# Patient Record
Sex: Female | Born: 1960 | Race: White | Hispanic: No | Smoking: Current every day smoker
Health system: Southern US, Community
[De-identification: ages and names within clinical notes are randomized; demographics above are authoritative.]

## PROBLEM LIST (undated history)

## (undated) DIAGNOSIS — N3281 Overactive bladder: Secondary | ICD-10-CM

## (undated) DIAGNOSIS — R0602 Shortness of breath: Secondary | ICD-10-CM

## (undated) DIAGNOSIS — Z87442 Personal history of urinary calculi: Secondary | ICD-10-CM

## (undated) DIAGNOSIS — M1990 Unspecified osteoarthritis, unspecified site: Secondary | ICD-10-CM

## (undated) DIAGNOSIS — N809 Endometriosis, unspecified: Secondary | ICD-10-CM

## (undated) DIAGNOSIS — T7840XA Allergy, unspecified, initial encounter: Secondary | ICD-10-CM

## (undated) DIAGNOSIS — J45909 Unspecified asthma, uncomplicated: Secondary | ICD-10-CM

## (undated) DIAGNOSIS — K5732 Diverticulitis of large intestine without perforation or abscess without bleeding: Secondary | ICD-10-CM

## (undated) DIAGNOSIS — F329 Major depressive disorder, single episode, unspecified: Secondary | ICD-10-CM

## (undated) DIAGNOSIS — E785 Hyperlipidemia, unspecified: Secondary | ICD-10-CM

## (undated) DIAGNOSIS — R51 Headache: Secondary | ICD-10-CM

## (undated) DIAGNOSIS — R519 Headache, unspecified: Secondary | ICD-10-CM

## (undated) DIAGNOSIS — F419 Anxiety disorder, unspecified: Secondary | ICD-10-CM

## (undated) DIAGNOSIS — K219 Gastro-esophageal reflux disease without esophagitis: Secondary | ICD-10-CM

## (undated) DIAGNOSIS — F32A Depression, unspecified: Secondary | ICD-10-CM

## (undated) DIAGNOSIS — I10 Essential (primary) hypertension: Secondary | ICD-10-CM

## (undated) HISTORY — DX: Hyperlipidemia, unspecified: E78.5

## (undated) HISTORY — DX: Diverticulitis of large intestine without perforation or abscess without bleeding: K57.32

## (undated) HISTORY — DX: Allergy, unspecified, initial encounter: T78.40XA

## (undated) HISTORY — DX: Personal history of urinary calculi: Z87.442

## (undated) HISTORY — PX: POLYPECTOMY: SHX149

## (undated) HISTORY — DX: Anxiety disorder, unspecified: F41.9

## (undated) HISTORY — DX: Overactive bladder: N32.81

## (undated) HISTORY — DX: Endometriosis, unspecified: N80.9

## (undated) HISTORY — DX: Headache, unspecified: R51.9

## (undated) HISTORY — DX: Major depressive disorder, single episode, unspecified: F32.9

## (undated) HISTORY — DX: Shortness of breath: R06.02

## (undated) HISTORY — DX: Depression, unspecified: F32.A

## (undated) HISTORY — DX: Headache: R51

## (undated) HISTORY — PX: HAND SURGERY: SHX662

## (undated) HISTORY — DX: Gastro-esophageal reflux disease without esophagitis: K21.9

## (undated) HISTORY — PX: COLON SURGERY: SHX602

## (undated) HISTORY — DX: Essential (primary) hypertension: I10

## (undated) HISTORY — PX: BACK SURGERY: SHX140

---

## 1998-11-08 ENCOUNTER — Emergency Department (HOSPITAL_COMMUNITY): Admission: EM | Admit: 1998-11-08 | Discharge: 1998-11-08 | Payer: Self-pay | Admitting: Emergency Medicine

## 1998-11-09 ENCOUNTER — Encounter: Payer: Self-pay | Admitting: Emergency Medicine

## 1998-12-10 ENCOUNTER — Ambulatory Visit (HOSPITAL_COMMUNITY): Admission: RE | Admit: 1998-12-10 | Discharge: 1998-12-10 | Payer: Self-pay | Admitting: Family Medicine

## 1998-12-10 ENCOUNTER — Encounter: Payer: Self-pay | Admitting: Family Medicine

## 1998-12-17 ENCOUNTER — Emergency Department (HOSPITAL_COMMUNITY): Admission: EM | Admit: 1998-12-17 | Discharge: 1998-12-17 | Payer: Self-pay | Admitting: Emergency Medicine

## 1998-12-17 ENCOUNTER — Encounter: Payer: Self-pay | Admitting: Emergency Medicine

## 1998-12-23 ENCOUNTER — Encounter: Admission: RE | Admit: 1998-12-23 | Discharge: 1998-12-23 | Payer: Self-pay | Admitting: Family Medicine

## 1998-12-23 ENCOUNTER — Encounter: Payer: Self-pay | Admitting: Family Medicine

## 2003-09-03 ENCOUNTER — Encounter: Admission: RE | Admit: 2003-09-03 | Discharge: 2003-09-03 | Payer: Self-pay | Admitting: Family Medicine

## 2003-12-10 ENCOUNTER — Ambulatory Visit: Payer: Self-pay | Admitting: Family Medicine

## 2004-01-08 ENCOUNTER — Emergency Department (HOSPITAL_COMMUNITY): Admission: EM | Admit: 2004-01-08 | Discharge: 2004-01-08 | Payer: Self-pay | Admitting: Family Medicine

## 2004-05-09 ENCOUNTER — Ambulatory Visit: Payer: Self-pay | Admitting: Family Medicine

## 2004-09-02 ENCOUNTER — Ambulatory Visit: Payer: Self-pay | Admitting: Family Medicine

## 2004-09-03 ENCOUNTER — Emergency Department (HOSPITAL_COMMUNITY): Admission: EM | Admit: 2004-09-03 | Discharge: 2004-09-03 | Payer: Self-pay | Admitting: Emergency Medicine

## 2005-02-20 ENCOUNTER — Other Ambulatory Visit: Admission: RE | Admit: 2005-02-20 | Discharge: 2005-02-20 | Payer: Self-pay | Admitting: Family Medicine

## 2005-02-20 ENCOUNTER — Ambulatory Visit: Payer: Self-pay | Admitting: Family Medicine

## 2005-02-20 ENCOUNTER — Encounter (INDEPENDENT_AMBULATORY_CARE_PROVIDER_SITE_OTHER): Payer: Self-pay | Admitting: Specialist

## 2005-02-22 ENCOUNTER — Ambulatory Visit: Payer: Self-pay | Admitting: Family Medicine

## 2005-06-08 ENCOUNTER — Ambulatory Visit: Payer: Self-pay | Admitting: Internal Medicine

## 2005-09-14 ENCOUNTER — Ambulatory Visit: Payer: Self-pay | Admitting: Family Medicine

## 2006-04-02 ENCOUNTER — Ambulatory Visit: Payer: Self-pay | Admitting: Family Medicine

## 2007-01-15 ENCOUNTER — Ambulatory Visit: Payer: Self-pay | Admitting: Family Medicine

## 2007-01-15 DIAGNOSIS — M479 Spondylosis, unspecified: Secondary | ICD-10-CM | POA: Insufficient documentation

## 2007-01-15 DIAGNOSIS — F32A Depression, unspecified: Secondary | ICD-10-CM | POA: Insufficient documentation

## 2007-01-15 DIAGNOSIS — I10 Essential (primary) hypertension: Secondary | ICD-10-CM | POA: Insufficient documentation

## 2007-01-15 DIAGNOSIS — F329 Major depressive disorder, single episode, unspecified: Secondary | ICD-10-CM

## 2007-02-05 ENCOUNTER — Telehealth: Payer: Self-pay | Admitting: Family Medicine

## 2007-04-05 ENCOUNTER — Ambulatory Visit: Payer: Self-pay | Admitting: Family Medicine

## 2007-04-05 ENCOUNTER — Encounter: Payer: Self-pay | Admitting: Internal Medicine

## 2007-04-08 LAB — CONVERTED CEMR LAB
ALT: 18 units/L (ref 0–35)
AST: 18 units/L (ref 0–37)
Albumin: 3.8 g/dL (ref 3.5–5.2)
Alkaline Phosphatase: 97 units/L (ref 39–117)
BUN: 11 mg/dL (ref 6–23)
Basophils Absolute: 0.1 10*3/uL (ref 0.0–0.1)
Basophils Relative: 0.7 % (ref 0.0–1.0)
Bilirubin, Direct: 0.2 mg/dL (ref 0.0–0.3)
CO2: 28 meq/L (ref 19–32)
Calcium: 9.6 mg/dL (ref 8.4–10.5)
Chloride: 104 meq/L (ref 96–112)
Creatinine, Ser: 0.6 mg/dL (ref 0.4–1.2)
Eosinophils Absolute: 0.1 10*3/uL (ref 0.0–0.6)
Eosinophils Relative: 0.7 % (ref 0.0–5.0)
GFR calc Af Amer: 140 mL/min
GFR calc non Af Amer: 115 mL/min
Glucose, Bld: 81 mg/dL (ref 70–99)
HCT: 43.1 % (ref 36.0–46.0)
Hemoglobin: 14.5 g/dL (ref 12.0–15.0)
Lymphocytes Relative: 29.3 % (ref 12.0–46.0)
MCHC: 33.5 g/dL (ref 30.0–36.0)
MCV: 92.9 fL (ref 78.0–100.0)
Monocytes Absolute: 0.7 10*3/uL (ref 0.2–0.7)
Monocytes Relative: 5.8 % (ref 3.0–11.0)
Neutro Abs: 7.7 10*3/uL (ref 1.4–7.7)
Neutrophils Relative %: 63.5 % (ref 43.0–77.0)
Platelets: 377 10*3/uL (ref 150–400)
Potassium: 4.5 meq/L (ref 3.5–5.1)
RBC: 4.64 M/uL (ref 3.87–5.11)
RDW: 12.2 % (ref 11.5–14.6)
Sodium: 136 meq/L (ref 135–145)
TSH: 1.09 microintl units/mL (ref 0.35–5.50)
Total Bilirubin: 0.5 mg/dL (ref 0.3–1.2)
Total Protein: 6.6 g/dL (ref 6.0–8.3)
WBC: 12.2 10*3/uL — ABNORMAL HIGH (ref 4.5–10.5)

## 2007-04-26 ENCOUNTER — Ambulatory Visit: Payer: Self-pay | Admitting: Family Medicine

## 2007-06-06 ENCOUNTER — Telehealth: Payer: Self-pay | Admitting: Family Medicine

## 2007-08-09 ENCOUNTER — Ambulatory Visit: Payer: Self-pay | Admitting: Family Medicine

## 2007-09-06 ENCOUNTER — Ambulatory Visit: Payer: Self-pay | Admitting: Family Medicine

## 2007-09-20 ENCOUNTER — Telehealth: Payer: Self-pay | Admitting: Family Medicine

## 2007-10-29 ENCOUNTER — Ambulatory Visit: Payer: Self-pay | Admitting: Family Medicine

## 2007-10-29 DIAGNOSIS — N951 Menopausal and female climacteric states: Secondary | ICD-10-CM | POA: Insufficient documentation

## 2007-11-21 ENCOUNTER — Ambulatory Visit: Payer: Self-pay | Admitting: Family Medicine

## 2007-11-21 ENCOUNTER — Telehealth: Payer: Self-pay | Admitting: Family Medicine

## 2007-12-05 ENCOUNTER — Telehealth: Payer: Self-pay | Admitting: Family Medicine

## 2008-03-12 ENCOUNTER — Telehealth: Payer: Self-pay | Admitting: Family Medicine

## 2008-03-31 ENCOUNTER — Telehealth: Payer: Self-pay | Admitting: Family Medicine

## 2008-04-07 ENCOUNTER — Telehealth: Payer: Self-pay | Admitting: Family Medicine

## 2008-08-11 ENCOUNTER — Ambulatory Visit: Payer: Self-pay | Admitting: Family Medicine

## 2008-08-11 LAB — CONVERTED CEMR LAB
Bilirubin Urine: NEGATIVE
Blood in Urine, dipstick: NEGATIVE
Glucose, Urine, Semiquant: NEGATIVE
Ketones, urine, test strip: NEGATIVE
Nitrite: NEGATIVE
Protein, U semiquant: NEGATIVE
Specific Gravity, Urine: 1.025
Urobilinogen, UA: 0.2
WBC Urine, dipstick: NEGATIVE
pH: 5

## 2008-08-12 ENCOUNTER — Encounter: Payer: Self-pay | Admitting: Family Medicine

## 2008-08-12 LAB — CONVERTED CEMR LAB
ALT: 17 units/L (ref 0–35)
AST: 17 units/L (ref 0–37)
Albumin: 3.7 g/dL (ref 3.5–5.2)
Alkaline Phosphatase: 94 units/L (ref 39–117)
BUN: 16 mg/dL (ref 6–23)
Basophils Absolute: 0 10*3/uL (ref 0.0–0.1)
Basophils Relative: 0.3 % (ref 0.0–3.0)
Bilirubin, Direct: 0 mg/dL (ref 0.0–0.3)
CO2: 28 meq/L (ref 19–32)
Calcium: 8.9 mg/dL (ref 8.4–10.5)
Chloride: 108 meq/L (ref 96–112)
Cholesterol: 208 mg/dL — ABNORMAL HIGH (ref 0–200)
Creatinine, Ser: 0.6 mg/dL (ref 0.4–1.2)
Direct LDL: 149.3 mg/dL
Eosinophils Absolute: 0.1 10*3/uL (ref 0.0–0.7)
Eosinophils Relative: 0.9 % (ref 0.0–5.0)
GFR calc non Af Amer: 114.29 mL/min (ref >60)
Glucose, Bld: 84 mg/dL (ref 70–99)
HCT: 43.1 % (ref 36.0–46.0)
HDL: 30.4 mg/dL — ABNORMAL LOW (ref >39.00)
Hemoglobin: 15 g/dL (ref 12.0–15.0)
Lymphocytes Relative: 33.2 % (ref 12.0–46.0)
Lymphs Abs: 3 10*3/uL (ref 0.7–4.0)
MCHC: 34.8 g/dL (ref 30.0–36.0)
MCV: 92.8 fL (ref 78.0–100.0)
Monocytes Absolute: 0.6 10*3/uL (ref 0.1–1.0)
Monocytes Relative: 6.6 % (ref 3.0–12.0)
Neutro Abs: 5.3 10*3/uL (ref 1.4–7.7)
Neutrophils Relative %: 59 % (ref 43.0–77.0)
Platelets: 312 10*3/uL (ref 150.0–400.0)
Potassium: 4.3 meq/L (ref 3.5–5.1)
RBC: 4.64 M/uL (ref 3.87–5.11)
RDW: 12.3 % (ref 11.5–14.6)
Sodium: 141 meq/L (ref 135–145)
TSH: 1.31 microintl units/mL (ref 0.35–5.50)
Total Bilirubin: 0.6 mg/dL (ref 0.3–1.2)
Total CHOL/HDL Ratio: 7
Total Protein: 6.8 g/dL (ref 6.0–8.3)
Triglycerides: 169 mg/dL — ABNORMAL HIGH (ref 0.0–149.0)
VLDL: 33.8 mg/dL (ref 0.0–40.0)
WBC: 9 10*3/uL (ref 4.5–10.5)

## 2008-08-17 ENCOUNTER — Encounter: Payer: Self-pay | Admitting: Family Medicine

## 2008-08-18 ENCOUNTER — Ambulatory Visit: Payer: Self-pay | Admitting: Family Medicine

## 2008-09-24 ENCOUNTER — Telehealth: Payer: Self-pay | Admitting: Family Medicine

## 2008-09-29 ENCOUNTER — Telehealth: Payer: Self-pay | Admitting: Family Medicine

## 2008-10-01 ENCOUNTER — Encounter: Payer: Self-pay | Admitting: Family Medicine

## 2008-10-16 ENCOUNTER — Ambulatory Visit: Payer: Self-pay | Admitting: Family Medicine

## 2008-10-20 LAB — CONVERTED CEMR LAB
Chlamydia, DNA Probe: NEGATIVE
GC Probe Amp, Genital: NEGATIVE
HCV Ab: NEGATIVE
Hep B Core Total Ab: NEGATIVE
Hep B S Ab: NEGATIVE
Hepatitis B Surface Ag: NEGATIVE

## 2008-11-03 ENCOUNTER — Telehealth: Payer: Self-pay | Admitting: Family Medicine

## 2009-01-12 ENCOUNTER — Telehealth: Payer: Self-pay | Admitting: Family Medicine

## 2009-01-28 ENCOUNTER — Ambulatory Visit: Payer: Self-pay | Admitting: Family Medicine

## 2009-01-28 DIAGNOSIS — K219 Gastro-esophageal reflux disease without esophagitis: Secondary | ICD-10-CM | POA: Insufficient documentation

## 2009-01-28 DIAGNOSIS — M1990 Unspecified osteoarthritis, unspecified site: Secondary | ICD-10-CM | POA: Insufficient documentation

## 2009-01-28 DIAGNOSIS — R1013 Epigastric pain: Secondary | ICD-10-CM | POA: Insufficient documentation

## 2009-02-01 ENCOUNTER — Encounter: Admission: RE | Admit: 2009-02-01 | Discharge: 2009-02-01 | Payer: Self-pay | Admitting: Family Medicine

## 2009-02-08 ENCOUNTER — Ambulatory Visit: Payer: Self-pay | Admitting: Family Medicine

## 2009-03-15 ENCOUNTER — Telehealth: Payer: Self-pay | Admitting: Family Medicine

## 2009-03-17 ENCOUNTER — Encounter: Payer: Self-pay | Admitting: Family Medicine

## 2009-03-19 ENCOUNTER — Telehealth: Payer: Self-pay | Admitting: Family Medicine

## 2009-03-22 ENCOUNTER — Telehealth: Payer: Self-pay | Admitting: Family Medicine

## 2009-03-23 ENCOUNTER — Ambulatory Visit: Payer: Self-pay | Admitting: Family Medicine

## 2009-03-25 ENCOUNTER — Encounter (INDEPENDENT_AMBULATORY_CARE_PROVIDER_SITE_OTHER): Payer: Self-pay | Admitting: *Deleted

## 2009-03-30 ENCOUNTER — Telehealth: Payer: Self-pay | Admitting: Family Medicine

## 2009-04-21 ENCOUNTER — Ambulatory Visit: Payer: Self-pay | Admitting: Gastroenterology

## 2009-04-21 ENCOUNTER — Encounter (INDEPENDENT_AMBULATORY_CARE_PROVIDER_SITE_OTHER): Payer: Self-pay | Admitting: *Deleted

## 2009-04-21 DIAGNOSIS — R109 Unspecified abdominal pain: Secondary | ICD-10-CM | POA: Insufficient documentation

## 2009-05-20 ENCOUNTER — Telehealth: Payer: Self-pay | Admitting: Gastroenterology

## 2009-06-24 ENCOUNTER — Telehealth: Payer: Self-pay | Admitting: Gastroenterology

## 2009-06-25 ENCOUNTER — Ambulatory Visit: Payer: Self-pay | Admitting: Gastroenterology

## 2009-06-25 HISTORY — PX: COLONOSCOPY: SHX174

## 2009-06-28 ENCOUNTER — Encounter: Payer: Self-pay | Admitting: Gastroenterology

## 2009-10-27 ENCOUNTER — Telehealth: Payer: Self-pay | Admitting: Family Medicine

## 2009-11-05 ENCOUNTER — Telehealth: Payer: Self-pay | Admitting: Family Medicine

## 2009-11-08 ENCOUNTER — Telehealth: Payer: Self-pay | Admitting: Family Medicine

## 2009-12-25 ENCOUNTER — Ambulatory Visit: Payer: Self-pay | Admitting: Internal Medicine

## 2009-12-30 ENCOUNTER — Telehealth: Payer: Self-pay | Admitting: Family Medicine

## 2010-01-07 ENCOUNTER — Telehealth: Payer: Self-pay | Admitting: Family Medicine

## 2010-02-07 ENCOUNTER — Ambulatory Visit
Admission: RE | Admit: 2010-02-07 | Discharge: 2010-02-07 | Payer: Self-pay | Source: Home / Self Care | Attending: Family Medicine | Admitting: Family Medicine

## 2010-02-07 DIAGNOSIS — F909 Attention-deficit hyperactivity disorder, unspecified type: Secondary | ICD-10-CM | POA: Insufficient documentation

## 2010-02-13 ENCOUNTER — Encounter: Payer: Self-pay | Admitting: Family Medicine

## 2010-02-24 NOTE — Progress Notes (Signed)
Summary: Colon tomorrow   Phone Note Call from Patient Call back at [phone] work   Call For: Dr Russella Dar Summary of Call: Didnt realize she should not have had the chicken salad sandwich she just ate for lunchtoday. She is scheduled for a Colonoscopy  tomorrow at 10:30am.  Initial call taken by: Leanor Kail [facility],  June 24, 2009 1:10 PM  Follow-up for Phone Call        Dr Russella Dar is off this afternoon so Dr. Marina Goodell was consulted.  Pts. phone call returned and pt was advised to go ahead with prep.  She was encouraged to drink plenty of clear liquids today to assist with the prep.  Pt. verbalized understanding. Follow-up by: Jennye Boroughs RN,  June 24, 2009 2:01 PM

## 2010-02-24 NOTE — Progress Notes (Signed)
Summary: Pts Zolft hasnt been mail out yet. Pt out of med.  Phone Note Call from Patient Call back at [facility] Phone [phone] Call back at Work Phone [phone]   Caller: Patient Summary of Call: Pt called and mail order pharmacy has not sent pt her Zoloft 200mg . Pt is completely out of meds. Pt want Dr. Clent Ridges to call in a partial refill to [facility] pharmacy today, to last patient until her mail order arrives.   Initial call taken by: Lucy Antigua,  January 12, 2009 9:06 AM  Follow-up for Phone Call        Phone Call Completed, Rx Called In Follow-up by: Alfred Levins, CMA,  January 12, 2009 9:11 AM    Prescriptions: ZOLOFT 100 MG TABS (SERTRALINE HCL) 2 once daily  #60 x 0   Entered by:   Alfred Levins, CMA   Authorized by:   Nelwyn Salisbury MD   Signed by:   Alfred Levins, CMA on 01/12/2009   Method used:   Electronically to        [facility] (retail)       [address]       [place], [place]  [address]       Ph: [phone]       Fax: [phone]   RxID:   [id number]

## 2010-02-24 NOTE — Assessment & Plan Note (Signed)
Summary: ?STD/CJR   Vital Signs:  Patient profile:   50 year old female Weight:      182 pounds Temp:     98.3 degrees F oral Pulse rate:   126 / minute BP sitting:   144 / 90  (left arm) Cuff size:   large  Vitals Entered By: Alfred Levins, CMA (October 16, 2008 10:33 AM) CC: std testing   History of Present Illness: here to get STD testing since she found out 2 weeks ago that her boyfriend has been cheating on her. She knows thia has been going on at least a year. She feels fine and has no symptoms. Had a normal pelvic exam and Pap smear per Dr. Tenny Craw this past July.   Allergies: 1)  ! Codeine  Past History:  Past Medical History: Reviewed history from 08/18/2008 and no changes required. Depression Hypertension sees Dr. Duane Lope for gyn exams overactive bladder, sees Dr. Marcelyn Bruins at [facility]  Past Surgical History: Reviewed history from 01/15/2007 and no changes required. Denies surgical history  Review of Systems  The patient denies anorexia, fever, weight loss, weight gain, vision loss, decreased hearing, hoarseness, chest pain, syncope, dyspnea on exertion, peripheral edema, prolonged cough, headaches, hemoptysis, abdominal pain, melena, hematochezia, severe indigestion/heartburn, hematuria, incontinence, genital sores, muscle weakness, suspicious skin lesions, transient blindness, difficulty walking, depression, unusual weight change, abnormal bleeding, enlarged lymph nodes, angioedema, breast masses, and testicular masses.    Physical Exam  General:  Well-developed,well-nourished,in no acute distress; alert,appropriate and cooperative throughout examination Genitalia:  Pelvic Exam:        External: normal female genitalia without lesions or masses        Vagina: normal without lesions or masses        Cervix: normal without lesions or masses        Adnexa: normal bimanual exam without masses or fullness        Uterus: normal by palpation        Pap smear:  not performed Psych:  Oriented X3, normally interactive, good eye contact, and tearful.     Impression & Recommendations:  Problem # 1:  SEXUALLY TRANSMITTED DISEASE, EXPOSURE TO (ICD-V01.6)  Orders: Venipuncture ([address]) T-Chlamydia Probe, genital (60454-09811) T-GC Probe, genital (91478-29562) T-Hepatitis B Core Antibody (13086-57846) T-Hepatitis B Surface Antigen (96295-28413) T-Hepatitis B Surface Antibody (24401-02725) T-Hepatitis C Antibody (36644-03474) T-HIV Antibody  (Reflex) [phone]) T-RPR (Syphilis) (43329-51884) T- * Misc. Laboratory test [id number])  Complete Medication List: 1)  Ambien 10 Mg Tabs (Zolpidem tartrate) .Marland Kitchen.. 1 by mouth at bedtime 2)  Diclofenac Sodium 50 Mg Tbec (Diclofenac sodium) .... Three times a day as needed pain 3)  Flexeril 10 Mg Tabs (Cyclobenzaprine hcl) .... Three times a day as needed spasm 4)  Zoloft 100 Mg Tabs (Sertraline hcl) .... 2 once daily 5)  Toviaz 4 Mg Xr24h-tab (Fesoterodine fumarate) .... Once daily 6)  Celebrex 200 Mg Caps (Celecoxib) .... Two times a day 7)  Alprazolam 2 Mg Tabs (Alprazolam) .... At bedtime  Patient Instructions: 1)  We will send for labs as above. I spent 30 minutes with her discussing the implications of this.

## 2010-02-24 NOTE — Progress Notes (Signed)
Summary: Tussionex  lmtcb 03/13/2008  Phone Note Call from Patient   Caller: Patient Call For: Dr. Clent Ridges Summary of Call: Pt is asking for a refill on Tussionex. Please call [phone] Initial call taken by: Lynann Beaver CMA,  March 12, 2008 3:06 PM  Follow-up for Phone Call        No, not without an ov Follow-up by: Nelwyn Salisbury MD,  March 13, 2008 10:51 AM  Additional Follow-up for Phone Call Additional follow up Details #1::        LMTCB Additional Follow-up by: Lynann Beaver CMA,  March 13, 2008 10:58 AM

## 2010-02-24 NOTE — Progress Notes (Signed)
Summary: REFILL REQUEST  Phone Note Call from Patient   Caller: Patient  - [phone] Summary of Call: Pt called to adv that [facility] never received the e-script for the zoloft that was sent on 11/05/09..... Pt req that someone call to clarify same.  Initial call taken by: Debbra Riding,  November 08, 2009 2:02 PM  Follow-up for Phone Call        done  takes zoloft 2 a day  pt aware Follow-up by: Pura Spice, RN,  November 08, 2009 2:27 PM    Prescriptions: ZOLOFT 100 MG TABS (SERTRALINE HCL) 2 once daily  #60 x 2   Entered by:   Pura Spice, RN   Authorized by:   Nelwyn Salisbury MD   Signed by:   Pura Spice, RN on 11/08/2009   Method used:   Telephoned to ...       [facility] (retail)       [address]       [place], [place]  [address]       Ph: [phone]       Fax: [phone]   RxID:   [id number]

## 2010-02-24 NOTE — Progress Notes (Signed)
Summary: refill xanax  Phone Note Refill Request Message from:  Pharmacy on March 30, 2009 8:32 AM  Refills Requested: Medication #1:  ALPRAZOLAM 2 MG TABS at bedtime  Method Requested: Electronic Initial call taken by: Alfred Levins, CMA,  March 30, 2009 8:32 AM  Follow-up for Phone Call        we already did this on 03-23-09 Follow-up by: Nelwyn Salisbury MD,  March 30, 2009 1:15 PM  Additional Follow-up for Phone Call Additional follow up Details #1::        Rx denial called/faxed to pharmacy Additional Follow-up by: Alfred Levins, CMA,  March 30, 2009 5:24 PM

## 2010-02-24 NOTE — Assessment & Plan Note (Signed)
Summary: 1 month rov/njr   Vital Signs:  Patient Profile:   50 Years Old Female Weight:      194 pounds Temp:     98.5 degrees F oral Pulse rate:   96 / minute Pulse rhythm:   regular BP sitting:   144 / 76  (left arm) Cuff size:   regular  Vitals Entered By: Raechel Ache, RN (September 06, 2007 3:16 PM)                 Chief Complaint:  ROV.Marland Kitchen  History of Present Illness: Here to follow up BP and depression. She had stopped all BP meds, but her BP has crept back up to 140's systolic. She is pleased with Cymbalta but would like to try a higher dose of it.     Current Allergies: ! CODEINE  Past Medical History:    Reviewed history from 01/15/2007 and no changes required:       Depression       Hypertension     Review of Systems      See HPI   Physical Exam  General:     Well-developed,well-nourished,in no acute distress; alert,appropriate and cooperative throughout examination    Impression & Recommendations:  Problem # 1:  HYPERTENSION (ICD-401.9)  Her updated medication list for this problem includes:    Hydrochlorothiazide 25 Mg Tabs (Hydrochlorothiazide) ..... Once daily   Problem # 2:  DEPRESSION (ICD-311)  Her updated medication list for this problem includes:    Cymbalta 60 Mg Cpep (Duloxetine hcl) ..... Once daily    Cymbalta 30 Mg Cpep (Duloxetine hcl) ..... Once daily   Complete Medication List: 1)  Detrol La 4 Mg Cp24 (Tolterodine tartrate) .Marland Kitchen.. 1 by mouth once daily 2)  Ambien 10 Mg Tabs (Zolpidem tartrate) .Marland Kitchen.. 1 by mouth at bedtime 3)  Diclofenac Sodium 50 Mg Tbec (Diclofenac sodium) .... Three times a day as needed pain 4)  Flexeril 10 Mg Tabs (Cyclobenzaprine hcl) .... Three times a day as needed spasm 5)  Seroquel 50 Mg Tabs (Quetiapine fumarate) .... Once daily 6)  Cymbalta 60 Mg Cpep (Duloxetine hcl) .... Once daily 7)  Cymbalta 30 Mg Cpep (Duloxetine hcl) .... Once daily 8)  Hydrochlorothiazide 25 Mg Tabs (Hydrochlorothiazide)  .... Once daily   Patient Instructions: 1)  Please schedule a follow-up appointment in 3 months.   Prescriptions: SEROQUEL 50 MG  TABS (QUETIAPINE FUMARATE) once daily  #90 x 3   Entered and Authorized by:   Nelwyn Salisbury MD   Signed by:   Nelwyn Salisbury MD on 09/06/2007   Method used:   Print then Give to Patient   RxID:   [id number] FLEXERIL 10 MG  TABS (CYCLOBENZAPRINE HCL) three times a day as needed spasm  #270 x 3   Entered and Authorized by:   Nelwyn Salisbury MD   Signed by:   Nelwyn Salisbury MD on 09/06/2007   Method used:   Print then Give to Patient   RxID:   [id number] DICLOFENAC SODIUM 50 MG  TBEC (DICLOFENAC SODIUM) three times a day as needed pain  #270 x 3   Entered and Authorized by:   Nelwyn Salisbury MD   Signed by:   Nelwyn Salisbury MD on 09/06/2007   Method used:   Print then Give to Patient   RxID:   [id number] AMBIEN 10 MG TABS (ZOLPIDEM TARTRATE) 1 by mouth at bedtime  #90 x 1   Entered  and Authorized by:   Nelwyn Salisbury MD   Signed by:   Nelwyn Salisbury MD on 09/06/2007   Method used:   Print then Give to Patient   RxID:   [id number] DETROL LA 4 MG CP24 (TOLTERODINE TARTRATE) 1 by mouth once daily  #90 x 3   Entered and Authorized by:   Nelwyn Salisbury MD   Signed by:   Nelwyn Salisbury MD on 09/06/2007   Method used:   Print then Give to Patient   RxID:   [id number] HYDROCHLOROTHIAZIDE 25 MG  TABS (HYDROCHLOROTHIAZIDE) once daily  #30 x 11   Entered and Authorized by:   Nelwyn Salisbury MD   Signed by:   Nelwyn Salisbury MD on 09/06/2007   Method used:   Electronically sent to ...       [facility]       [address]       [place], [place]  [id number]       Ph: [phone]       Fax: [phone]   RxID:   [id number] CYMBALTA 30 MG  CPEP (DULOXETINE HCL) once daily  #90 x 3   Entered and Authorized by:   Nelwyn Salisbury MD   Signed by:   Nelwyn Salisbury MD on 09/06/2007   Method used:   Print then Give to  Patient   RxID:   [id number] CYMBALTA 60 MG  CPEP (DULOXETINE HCL) once daily  #90 x 3   Entered and Authorized by:   Nelwyn Salisbury MD   Signed by:   Nelwyn Salisbury MD on 09/06/2007   Method used:   Print then Give to Patient   RxID:   [id number]  ]

## 2010-02-24 NOTE — Letter (Signed)
Summary: [facility]  [facility]   Imported By: Maryln Gottron 09/09/2008 12:21:37  _____________________________________________________________________  External Attachment:    Type:   Image     Comment:   External Document

## 2010-02-24 NOTE — Medication Information (Signed)
Summary: Prior Authorization Delayed-Pantoprazole Sodium  Prior Authorization Delayed-Pantoprazole Sodium   Imported By: Maryln Gottron 03/22/2009 13:41:12  _____________________________________________________________________  External Attachment:    Type:   Image     Comment:   External Document

## 2010-02-24 NOTE — Progress Notes (Signed)
Summary: prior auth  Phone Note Call from Patient   Caller: Patient Call For: Nelwyn Salisbury MD Summary of Call: Protonix not authorized yet, she wants to talk to Dr Clent Ridges Initial call taken by: Raechel Ache, RN,  March 22, 2009 2:54 PM  Follow-up for Phone Call        Phone Call Completed, Appt Scheduled Today Follow-up by: Alfred Levins, CMA,  March 23, 2009 10:20 AM

## 2010-02-24 NOTE — Progress Notes (Signed)
Summary: refill zolpidem  Phone Note From Pharmacy   Caller: [facility] Call For: Clent Ridges  Summary of Call: refill zolpidem 10mg  1 by mouth at bedtime mail order Initial call taken by: Alfred Levins, CMA,  April 07, 2008 12:43 PM  Follow-up for Phone Call        done Follow-up by: Nelwyn Salisbury MD,  April 07, 2008 12:56 PM  Additional Follow-up for Phone Call Additional follow up Details #1::        rx faxed Additional Follow-up by: Alfred Levins, CMA,  April 07, 2008 1:16 PM    New/Updated Medications: AMBIEN 10 MG TABS (ZOLPIDEM TARTRATE) 1 by mouth at bedtime   Prescriptions: AMBIEN 10 MG TABS (ZOLPIDEM TARTRATE) 1 by mouth at bedtime  #90 x 1   Entered and Authorized by:   Nelwyn Salisbury MD   Signed by:   Nelwyn Salisbury MD on 04/07/2008   Method used:   Print then Give to Patient   RxID:   [id number]

## 2010-02-24 NOTE — Progress Notes (Signed)
Summary: RX REFILL  Phone Note Call from Patient Call back at [phone]   Caller: PT LIVE Call For: Jossalin Chervenak Summary of Call: PATIENT NEEDS RX REFILL FOR TUSSNEX,  [facility]. Initial call taken by: Celine Ahr,  December 05, 2007 10:13 AM  Follow-up for Phone Call        call in Tussionex, take one tsp two times a day as needed cough, 240 ml with no rf Follow-up by: Nelwyn Salisbury MD,  December 05, 2007 12:31 PM      Prescriptions: Sandria Senter ER 8-10 MG/5ML LQCR (CHLORPHENIRAMINE-HYDROCODONE) 1 tsp two times a day as needed cough  #200 ml x 0   Entered by:   Lynann Beaver CMA   Authorized by:   Nelwyn Salisbury MD   Signed by:   Lynann Beaver CMA on 12/05/2007   Method used:   Telephoned to ...       [facility] (retail)       [address]       [place], [place]  [address]       Ph: [phone]       Fax: [phone]   RxID:   [id number]

## 2010-02-24 NOTE — Progress Notes (Signed)
Summary: cannot take 1/2 atenolo  Phone Note Call from Patient Call back at [phone]   Call For: fry Summary of Call: BP med Atenolol making her sick on her stomach.  Need change to [facility].  Allergic to Codeine. Initial call taken by: Rudy Jew, RN,  Jun 06, 2007 10:53 AM  Follow-up for Phone Call        since it is working so well, try taking 25 mg Atenolol a day (half a tablet)  Follow-up by: Nelwyn Salisbury MD,  Jun 06, 2007 12:53 PM  Additional Follow-up for Phone Call Additional follow up Details #1::        Had already cut med in half & tried that.  It still makes her sick & aggravates acid reflux. Additional Follow-up by: Rudy Jew, RN,  Jun 06, 2007 2:38 PM    Additional Follow-up for Phone Call Additional follow up Details #2::    ok, stop Atenolol and call in Lisinopril 10 mg once daily , #30 with 2 rf. Follow-up by: Nelwyn Salisbury MD,  Jun 07, 2007 9:02 AM  Additional Follow-up for Phone Call Additional follow up Details #3:: Details for Additional Follow-up Action Taken: rx called in, pt aware Additional Follow-up by: Alfred Levins, CMA,  Jun 07, 2007 10:14 AM  New/Updated Medications: LISINOPRIL 10 MG  TABS (LISINOPRIL) Take 1 tab by mouth daily   Prescriptions: LISINOPRIL 10 MG  TABS (LISINOPRIL) Take 1 tab by mouth daily  #30 x 2   Entered by:   Alfred Levins, CMA   Authorized by:   Nelwyn Salisbury MD   Signed by:   Alfred Levins, CMA on 06/07/2007   Method used:   Electronically sent to ...       [facility]       [address]       [place], [place]  [id number]       Ph: [phone]       Fax: [phone]   RxID:   [id number]

## 2010-02-24 NOTE — Procedures (Signed)
Summary: Colonoscopy  Patient: Emily Avery Note: All result statuses are Final unless otherwise noted.  Tests: (1) Colonoscopy (COL)   COL Colonoscopy           DONE     [facility]     [address]     [place], [place]  [address]           COLONOSCOPY PROCEDURE REPORT           PATIENT:  [name], [name]  MR#:  [id number]     BIRTHDATE:  [birth year], 49 yrs. old  GENDER:  female     ENDOSCOPIST:  Judie Petit T. Russella Dar, MD, [facility]     Referred by:  Tera Mater. Clent Ridges, M.D.     PROCEDURE DATE:  06/25/2009     PROCEDURE:  Colonoscopy with biopsy and snare polypectomy     ASA CLASS:  Class II     INDICATIONS:  1) change in bowel habits  2) abdominal pain     MEDICATIONS:   Fentanyl 100 mcg IV, Versed 10 mg IV, Benadryl 50     mg IV     DESCRIPTION OF PROCEDURE:   After the risks benefits and     alternatives of the procedure were thoroughly explained, informed     consent was obtained.  Digital rectal exam was performed and     revealed no abnormalities.   The LB [id number] endoscope     was introduced through the anus and advanced to the cecum, which     was identified by both the appendix and ileocecal valve, without     limitations.  The quality of the prep was excellent, using     MoviPrep.  The instrument was then slowly withdrawn as the colon     was fully examined.     <<PROCEDUREIMAGES>>     FINDINGS:  A sessile polyp was found at the hepatic flexure. It     was 5 mm in size. Polyp was snared without cautery. Retrieval was     successful.  Three polyps were found in the sigmoid colon. They     were 3 - 4 mm in size. The polyps were removed using cold biopsy     forceps.  This was otherwise a normal examination of the colon.     Retroflexed views in the rectum revealed no abnormalities. The time     to cecum =  2.33  minutes. The scope was then withdrawn (time =     14  min) from the patient and the procedure completed.           COMPLICATIONS:  None         ENDOSCOPIC IMPRESSION:     1) 5 mm sessile polyp at the hepatic flexure     2) 3 - 4 mm, three polyps in the sigmoid colon           RECOMMENDATIONS:     1) Await pathology results     2) High fiber diet with liberal fluid intake.     3) If the polyps removed today adenomatous (pre-cancerous), you     will need a repeat colonoscopy in 5 years. Otherwise you should     continue to follow colorectal cancer screening guidelines for     "routine risk" patients with colonoscopy in 10 years.     4) Continue treatment for IBS           Emile Kyllo T. Russella Dar, MD, Clement[facility] Graham  n.     eSIGNED:   Jacari Iannello T. Lavaya Defreitas at 06/25/2009 11:05 AM           Pilar Grammes, [id number]  Note: An exclamation mark (!) indicates a result that was not dispersed into the flowsheet. Document Creation Date: 06/25/2009 11:05 AM _______________________________________________________________________  (1) Order result status: Final Collection or observation date-time: 06/25/2009 11:00 Requested date-time:  Receipt date-time:  Reported date-time:  Referring Physician:   Ordering Physician: Claudette Head [id number]) Specimen Source:  Source: Launa Grill Order Number: [id number] Lab site:   Appended Document: Colonoscopy     Procedures Next Due Date:    Colonoscopy: 06/2014

## 2010-02-24 NOTE — Assessment & Plan Note (Signed)
Summary: EPIGASTRIC PAIN/YF   History of Present Illness Visit Type: Initial Consult Primary GI MD: Elie Goody MD [facility] Primary Provider: Gershon Crane, MD Requesting Provider: Gershon Crane, MD Chief Complaint: Abdominal pain x 3 months History of Present Illness:   This is a 50 year old female relates generalized abdominal pain, often improved following a bowel movement. She has had problems with constipation and occasional diarrhea for approximately the past 6 months. Her abdominal pain has been bothersome for about 3 months. Her recent abdominal ultrasound was unremarkable. She has chronic GERD and lower under fair control on omeprazole. They have been under better control on pantoprazole.   GI Review of Systems    Reports abdominal pain, acid reflux, belching, and  bloating.     Location of  Abdominal pain: lower abdomen.    Denies chest pain, dysphagia with liquids, dysphagia with solids, heartburn, loss of appetite, nausea, vomiting, vomiting blood, weight loss, and  weight gain.      Reports constipation and  diarrhea.     Denies anal fissure, black tarry stools, change in bowel habit, diverticulosis, fecal incontinence, heme positive stool, hemorrhoids, irritable bowel syndrome, jaundice, light color stool, liver problems, rectal bleeding, and  rectal pain. Current Medications (verified): 1)  Ambien 10 Mg Tabs (Zolpidem Tartrate) .Marland Kitchen.. 1 By Mouth At Bedtime 2)  Flexeril 10 Mg  Tabs (Cyclobenzaprine Hcl) .... Three Times A Day As Needed Spasm 3)  Zoloft 100 Mg Tabs (Sertraline Hcl) .... 2 Once Daily 4)  Alprazolam 2 Mg Tabs (Alprazolam) .... At Bedtime 5)  Omeprazole 20 Mg Cpdr (Omeprazole) .... Two Times A Day 6)  Promethazine Hcl 25 Mg Tabs (Promethazine Hcl) .Marland Kitchen.. 1 Q 4 Hours As Needed Nausea 7)  Tramadol Hcl 50 Mg Tabs (Tramadol Hcl) .Marland Kitchen.. 1 or 2 Every 6 Hours As Needed Pain  Allergies (verified): 1)  ! Codeine  Past History:  Past Medical History: Reviewed history from  01/28/2009 and no changes required. Depression Hypertension sees Dr. Duane Lope for gyn exams overactive bladder, sees Dr. Marcelyn Bruins at [facility] Osteoarthritis GERD  Past Surgical History: Reviewed history from 01/15/2007 and no changes required. Denies surgical history  Family History: Reviewed history from 01/15/2007 and no changes required. Family History of Alcoholism/Addiction Family History of Arthritis Family History Diabetes 1st degree relative Family History Hypertension Family History of Cardiovascular disorder Family History of Colon Cancer:GF  Social History: Divorced Current Smoker Alcohol use-no Drug use-no Daily Caffeine Use 3-4  Review of Systems       The patient complains of anxiety-new, arthritis/joint pain, back pain, itching, muscle pains/cramps, night sweats, sleeping problems, and sore throat.         The pertinent positives and negatives are noted as above and in the HPI. All other ROS were reviewed and were negative.  Vital Signs:  Patient profile:   50 year old female Height:      61.5 inches Weight:      184.25 pounds BMI:     34.37 Pulse rate:   80 / minute Pulse rhythm:   regular BP sitting:   144 / 86  (left arm) Cuff size:   regular  Vitals Entered By: June McMurray CMA Duncan Dull) (April 21, 2009 3:10 PM)  Physical Exam  General:  Well developed, well nourished, no acute distress. obese.   Head:  Normocephalic and atraumatic. Eyes:  PERRLA, no icterus. Ears:  Normal auditory acuity. Mouth:  No deformity or lesions, dentition normal. Neck:  Supple; no masses or  thyromegaly. Lungs:  Clear throughout to auscultation. Heart:  Regular rate and rhythm; no murmurs, rubs,  or bruits. Abdomen:  Soft, nontender and nondistended. No masses, hepatosplenomegaly or hernias noted. Normal bowel sounds. Rectal:  Normal exam. hemocult negative soft brown stool.   Msk:  Symmetrical with no gross deformities. Normal posture. Pulses:  Normal pulses  noted. Extremities:  No clubbing, cyanosis, edema or deformities noted. Neurologic:  Alert and  oriented x4;  grossly normal neurologically. Cervical Nodes:  No significant cervical adenopathy. Inguinal Nodes:  No significant inguinal adenopathy. Psych:  Alert and cooperative. Normal mood and affect.  Impression & Recommendations:  Problem # 1:  ABDOMINAL PAIN-MULTIPLE SITES (ICD-789.09) Abdominal pain, associated with a change in bowel habits, and relief of pain with bowel movements. Symptoms are atypical for irritable bowel syndrome. Rule out inflammatory bowel disease colorectal neoplasm. Other disorders. Trial of glycopyrrolate 1 mg twice a day and Align once daily for one month. The risks, benefits and alternatives to colonoscopy with possible biopsy and possible polypectomy were discussed with the patient and they consent to proceed. The procedure will be scheduled electively.  Problem # 2:  CHANGE IN BOWELS (ZOX-096.04) See problem #1.  Problem # 3:  GERD (ICD-530.81) Symptoms under fair control. Increase omeprazole to 40 mg twice daily and begin standard antireflux measures.  Other Orders: Colonoscopy (Colon)  Patient Instructions: 1)  Please pick up your omeprazole 40 mg two times a day at your pharmacy. A prescription has been sent. 2)  Avoid foods high in acid content ( tomatoes, citrus juices, spicy foods) . Avoid eating within 3 to 4 hours of lying down or before exercising. Do not over eat; try smaller more frequent meals. Elevate head of bed four inches when sleeping.  3)  Please pick up your glycopyrolate at the pharmacy. A prescription has been sent for you. 4)  Please come for your scheduled colonoscopy on 04/23/09 at [facility] 4th floor. You will also need to pick up your Moviprep kit beforehand at the pharmacy. 5)  Take Align once daily x 2 months. This can be purchased over the counter. 6)  Copy sent to : Dr. Claris Che  7)  The medication list was reviewed and reconciled.   All changed / newly prescribed medications were explained.  A complete medication list was provided to the patient / caregiver. 8)  Colonoscopy brochure given.   Prescriptions: GLYCOPYRROLATE 1 MG TABS (GLYCOPYRROLATE) Take 1 tablet by mouth two times a day  #60 x 11   Entered by:   Hortense Ramal CMA (AAMA)   Authorized by:   Meryl Dare MD [facility]   Signed by:   Hortense Ramal CMA Duncan Dull) on 04/21/2009   Method used:   Electronically to        [facility] (retail)       [address]       [place], [place]  [id number]       Ph: [phone]       Fax: [phone]   RxID:   [id number] OMEPRAZOLE 40 MG CPDR (OMEPRAZOLE) Take 1 tablet by mouth two times a day  #60 x 11   Entered by:   Hortense Ramal CMA (AAMA)   Authorized by:   Meryl Dare MD [facility]   Signed by:   Hortense Ramal CMA Duncan Dull) on 04/21/2009   Method used:   Electronically to        [facility] (retail)  [address]       [place], [place]  [address]       Ph: [phone]       Fax: [phone]   RxID:   [id number] MOVIPREP 100 GM  SOLR (PEG-KCL-NACL-NASULF-NA ASC-C) As per prep instructions.  #1 x 0   Entered by:   Christie Nottingham CMA (AAMA)   Authorized by:   Meryl Dare MD [facility]   Signed by:   Christie Nottingham CMA Duncan Dull) on 04/21/2009   Method used:   Electronically to        [facility] (retail)       [address]       [place], [place]  [id number]       Ph: [phone]       Fax: [phone]   RxID:   [id number]

## 2010-02-24 NOTE — Letter (Signed)
Summary: Patient Notice- Polyp Results  [facility]  [address] [place], [place] [address]   Phone: [phone]  Fax: [phone]        June 28, 2009 MRN: [id number]    [facility] [address] [place], [place]  [address]    Dear Ms. SUTTON,  I am pleased to inform you that the colon polyp(s) removed during your recent colonoscopy was (were) found to be benign (no cancer detected) upon pathologic examination.  I recommend you have a repeat colonoscopy examination in 5 years to look for recurrent polyps, as having colon polyps increases your risk for having recurrent polyps or even colon cancer in the future.  Should you develop new or worsening symptoms of abdominal pain, bowel habit changes or bleeding from the rectum or bowels, please schedule an evaluation with either your primary care physician or with me.  Continue treatment plan as outlined the day of your exam.  Please call us if you are having persistent problems or have questions about your condition that have not been fully answered at this time.  Sincerely,  Meryl Dare MD [facility]  This letter has been electronically signed by your physician.  Appended Document: Patient Notice- Polyp Results letter mailed.

## 2010-02-24 NOTE — Progress Notes (Signed)
Summary: change rx   Phone Note Call from Patient Call back at Home Phone [phone]   Caller: pt live Call For: Clent Ridges  Summary of Call: cymbalta 60 mg  and 30 mg.  They would charge her $75  for each one.  Could you write her an rx for all 30mg .  Qty #90 per 30 days.   Needs a 90 day supply.  Patient will pick up written rx  Initial call taken by: Roselle Locus,  September 20, 2007 10:59 AM  Follow-up for Phone Call        done Follow-up by: Nelwyn Salisbury MD,  September 20, 2007 2:52 PM  Additional Follow-up for Phone Call Additional follow up Details #1::        rx up front, pt aware Additional Follow-up by: Alfred Levins, CMA,  September 20, 2007 5:34 PM    New/Updated Medications: CYMBALTA 30 MG  CPEP (DULOXETINE HCL) 3 once daily   Prescriptions: CYMBALTA 30 MG  CPEP (DULOXETINE HCL) 3 once daily  #270 x 3   Entered and Authorized by:   Nelwyn Salisbury MD   Signed by:   Nelwyn Salisbury MD on 09/20/2007   Method used:   Print then Give to Patient   RxID:   [id number]

## 2010-02-24 NOTE — Progress Notes (Signed)
Summary: prior auth  Phone Note Call from Patient   Caller: Patient Call For: nurse Summary of Call: calling to ask what the holdup is for her protonix- has [facility]. I told her I had called her insurance co for forms- we had not received yet and that this was a process- not our fault, insurance company's protocol for prior auth Initial call taken by: Raechel Ache, RN,  March 19, 2009 11:25 AM  Follow-up for Phone Call        noted Follow-up by: Nelwyn Salisbury MD,  March 19, 2009 5:05 PM

## 2010-02-24 NOTE — Letter (Signed)
Summary: [facility] Instructions  [facility]  [address] [place], [place] [address]   Phone: [phone]  Fax: [phone]       ABRIL Avery    [birth year]    MRN: [id number]        Procedure Day /Date: 04/23/09 Friday     Arrival Time: 8:00 am     Procedure Time: 9:00 am     Location of Procedure:                    _x _  [facility] (4th Floor)  PREPARATION FOR COLONOSCOPY WITH MOVIPREP   Starting 5 days prior to your procedure (today) do not eat nuts, seeds, popcorn, corn, beans, peas,  salads, or any raw vegetables.  Do not take any fiber supplements (e.g. Metamucil, Citrucel, and Benefiber).  THE DAY BEFORE YOUR PROCEDURE         DATE: 04/22/09  DAY: Thursday  1.  Drink clear liquids the entire day-NO SOLID FOOD  2.  Do not drink anything colored red or purple.  Avoid juices with pulp.  No orange juice.  3.  Drink at least 64 oz. (8 glasses) of fluid/clear liquids during the day to prevent dehydration and help the prep work efficiently.  CLEAR LIQUIDS INCLUDE: Water Jello Ice Popsicles Tea (sugar ok, no milk/cream) Powdered fruit flavored drinks Coffee (sugar ok, no milk/cream) Gatorade Juice: apple, white grape, white cranberry  Lemonade Clear bullion, consomm, broth Carbonated beverages (any kind) Strained chicken noodle soup Hard Candy                             4.  In the morning, mix first dose of MoviPrep solution:    Empty 1 Pouch A and 1 Pouch B into the disposable container    Add lukewarm drinking water to the top line of the container. Mix to dissolve    Refrigerate (mixed solution should be used within 24 hrs)  5.  Begin drinking the prep at 5:00 p.m. The MoviPrep container is divided by 4 marks.   Every 15 minutes drink the solution down to the next mark (approximately 8 oz) until the full liter is complete.   6.  Follow completed prep with 16 oz of clear liquid of your choice (Nothing red or purple).   Continue to drink clear liquids until bedtime.  7.  Before going to bed, mix second dose of MoviPrep solution:    Empty 1 Pouch A and 1 Pouch B into the disposable container    Add lukewarm drinking water to the top line of the container. Mix to dissolve    Refrigerate  THE DAY OF YOUR PROCEDURE      DATE: 04/23/09 DAY: Friday  Beginning at 4:00 a.m. (5 hours before procedure):         1. Every 15 minutes, drink the solution down to the next mark (approx 8 oz) until the full liter is complete.  2. Follow completed prep with 16 oz. of clear liquid of your choice.    3. You may drink clear liquids until 7:00 am(2 HOURS BEFORE PROCEDURE).   MEDICATION INSTRUCTIONS  Unless otherwise instructed, you should take regular prescription medications with a small sip of water   as early as possible the morning of your procedure.          OTHER INSTRUCTIONS  You will need a responsible adult at least 48  years of age to accompany you and drive you home.   This person must remain in the waiting room during your procedure.  Wear loose fitting clothing that is easily removed.  Leave jewelry and other valuables at home.  However, you may wish to bring a book to read or  an iPod/MP3 player to listen to music as you wait for your procedure to start.  Remove all body piercing jewelry and leave at home.  Total time from sign-in until discharge is approximately 2-3 hours.  You should go home directly after your procedure and rest.  You can resume normal activities the  day after your procedure.  The day of your procedure you should not:   Drive   Make legal decisions   Operate machinery   Drink alcohol   Return to work  You will receive specific instructions about eating, activities and medications before you leave.    The above instructions have been reviewed and explained to me by   Hortense Ramal CMA Duncan Dull)  April 21, 2009 3:41 PM     I fully understand and can verbalize  these instructions _____________________________ Date 04/21/09

## 2010-02-24 NOTE — Progress Notes (Signed)
Summary: REFILL REQUEST ([facility])  Phone Note Refill Request Message from:  Patient on December 30, 2009 12:10 PM  Refills Requested: Medication #1:  AMBIEN 10 MG TABS 1 by mouth at bedtime   Notes: [facility].  Medication #2:  ALPRAZOLAM 2 MG TABS at bedtime   Notes: [facility].    Initial call taken by: Debbra Riding,  December 30, 2009 12:09 PM  Follow-up for Phone Call        done Follow-up by: Nelwyn Salisbury MD,  January 03, 2010 6:03 PM    Prescriptions: ALPRAZOLAM 2 MG TABS (ALPRAZOLAM) at bedtime  #90 x 1   Entered and Authorized by:   Nelwyn Salisbury MD   Signed by:   Nelwyn Salisbury MD on 01/03/2010   Method used:   Print then Give to Patient   RxID:   [id number] AMBIEN 10 MG TABS (ZOLPIDEM TARTRATE) 1 by mouth at bedtime  #90 x 1   Entered and Authorized by:   Nelwyn Salisbury MD   Signed by:   Nelwyn Salisbury MD on 01/03/2010   Method used:   Print then Give to Patient   RxID:   [id number]

## 2010-02-24 NOTE — Assessment & Plan Note (Signed)
Summary: BP running high, feeling tired, no c/o headache/nn  Medications Added ATENOLOL 50 MG  TABS (ATENOLOL) once daily        Vital Signs:  Patient Profile:   50 Years Old Female Weight:      194.5 pounds Temp:     98.5 degrees F BP sitting:   160 / 94  Vitals Entered By: Sindy Guadeloupe RN (April 05, 2007 2:08 PM)                 Chief Complaint:  bp high yesterday--150/110 & 152/116.  History of Present Illness: Over the past few weeks has had some HA's and some fatigue. No chest pains or SOB. Has checked her BP several times and gets systolic readings in the 150's and 160's.     Current Allergies (reviewed today): ! CODEINE  Past Medical History:    Reviewed history from 01/15/2007 and no changes required:       Depression       Hypertension  Past Surgical History:    Reviewed history from 01/15/2007 and no changes required:       Denies surgical history   Family History:    Reviewed history from 01/15/2007 and no changes required:       Family History of Alcoholism/Addiction       Family History of Arthritis       Family History Diabetes 1st degree relative       Family History Hypertension       Family History of Cardiovascular disorder  Social History:    Reviewed history from 01/15/2007 and no changes required:       Divorced       Current Smoker       Alcohol use-no       Drug use-no    Review of Systems      See HPI   Physical Exam  General:     overweight-appearing.   Neck:     No deformities, masses, or tenderness noted. Lungs:     Normal respiratory effort, chest expands symmetrically. Lungs are clear to auscultation, no crackles or wheezes. Heart:     Normal rate and regular rhythm. S1 and S2 normal without gallop, murmur, click, rub or other extra sounds. EKG normal.    Impression & Recommendations:  Problem # 1:  HYPERTENSION (ICD-401.9)  Orders: EKG w/ Interpretation (93000) UA Dipstick w/o Micro (automated)   ([address]1003) Venipuncture ([address]) TLB-BMP (Basic Metabolic Panel-BMET) (80048-METABOL) TLB-CBC Platelet - w/Differential (85025-CBCD) TLB-Hepatic/Liver Function Pnl (80076-HEPATIC) TLB-TSH (Thyroid Stimulating Hormone) (84443-TSH)  Her updated medication list for this problem includes:    Atenolol 50 Mg Tabs (Atenolol) ..... Once daily   Complete Medication List: 1)  Detrol La 4 Mg Cp24 (Tolterodine tartrate) .Marland Kitchen.. 1 by mouth once daily 2)  Ambien 10 Mg Tabs (Zolpidem tartrate) .Marland Kitchen.. 1 by mouth at bedtime 3)  Zoloft 100 Mg Tabs (Sertraline hcl) .... 2 by mouth once daily 4)  Diclofenac Sodium 50 Mg Tbec (Diclofenac sodium) .... Three times a day as needed pain 5)  Flexeril 10 Mg Tabs (Cyclobenzaprine hcl) .... Three times a day as needed spasm 6)  Seroquel 50 Mg Tabs (Quetiapine fumarate) .... Once daily 7)  Atenolol 50 Mg Tabs (Atenolol) .... Once daily   Patient Instructions: 1)  Recheck in 3 weeks.    Prescriptions: ATENOLOL 50 MG  TABS (ATENOLOL) once daily  #30 x 0   Entered and Authorized by:   Nelwyn Salisbury MD  Signed by:   Nelwyn Salisbury MD on 04/05/2007   Method used:   Electronically sent to ...       [facility]       [address]       [place], [place]  [id number]       Ph: [phone]       Fax: [phone]   RxID:   [id number]  ]  Appended Document: BP running high, feeling tired, no c/o headache/nn  Laboratory Results   Urine Tests    Routine Urinalysis   Color: yellow Appearance: Clear Glucose: negative   (Normal Range: Negative) Bilirubin: negative   (Normal Range: Negative) Ketone: negative   (Normal Range: Negative) Spec. Gravity: 1.015   (Normal Range: 1.003-1.035) Blood: negative   (Normal Range: Negative) pH: 6.0   (Normal Range: 5.0-8.0) Protein: negative   (Normal Range: Negative) Urobilinogen: 0.2   (Normal Range: 0-1) Nitrite: negative   (Normal Range: Negative) Leukocyte Esterace: negative   (Normal  Range: Negative)    Comments: ...................................................................Milica Zimonjic  April 05, 2007 5:28 PM

## 2010-02-24 NOTE — Assessment & Plan Note (Signed)
Summary: Bronchitis, sever cough, OV OKed Dr Evangeline Dakin   Vital Signs:  Patient Profile:   50 Years Old Female Weight:      191 pounds Temp:     98.8 degrees F oral Pulse rate:   106 / minute BP sitting:   128 / 84  (left arm) Cuff size:   large  Vitals Entered By: Alfred Levins, CMA (November 21, 2007 10:01 AM)                 Chief Complaint:  cough started yesterday.  History of Present Illness: 3 days of deep cough productive of green sputum. Some wheezing and SOB. No fever or body aches. On fluids and cough meds.     Current Allergies (reviewed today): ! CODEINE  Past Medical History:    Reviewed history from 10/29/2007 and no changes required:       Depression       Hypertension       sees Dr. Duane Lope for gyn exams  Past Surgical History:    Reviewed history from 01/15/2007 and no changes required:       Denies surgical history     Review of Systems  The patient denies anorexia, fever, weight loss, weight gain, vision loss, decreased hearing, hoarseness, chest pain, syncope, dyspnea on exertion, peripheral edema, headaches, hemoptysis, abdominal pain, melena, hematochezia, severe indigestion/heartburn, hematuria, incontinence, genital sores, muscle weakness, suspicious skin lesions, transient blindness, difficulty walking, depression, unusual weight change, abnormal bleeding, enlarged lymph nodes, angioedema, breast masses, and testicular masses.     Physical Exam  General:     Well-developed,well-nourished, alert,appropriate and cooperative throughout examination. Frequent coughing. Head:     Normocephalic and atraumatic without obvious abnormalities. No apparent alopecia or balding. Eyes:     No corneal or conjunctival inflammation noted. EOMI. Perrla. Funduscopic exam benign, without hemorrhages, exudates or papilledema. Vision grossly normal. Ears:     External ear exam shows no significant lesions or deformities.  Otoscopic examination reveals clear  canals, tympanic membranes are intact bilaterally without bulging, retraction, inflammation or discharge. Hearing is grossly normal bilaterally. Nose:     External nasal examination shows no deformity or inflammation. Nasal mucosa are pink and moist without lesions or exudates. Mouth:     Oral mucosa and oropharynx without lesions or exudates.  Teeth in good repair. Neck:     No deformities, masses, or tenderness noted. Lungs:     scattered wheezes and rhonchi, no rales    Impression & Recommendations:  Problem # 1:  ACUTE BRONCHITIS (ICD-466.0)  Orders: Nebulizer Tx (11914) Depo- Medrol 80mg  (J1040) Admin of Therapeutic Inj  intramuscular or subcutaneous (78295)  Her updated medication list for this problem includes:    Augmentin 875-125 Mg Tabs (Amoxicillin-pot clavulanate) .Marland Kitchen..Marland Kitchen Two times a day    Tussionex Pennkinetic Er 8-10 Mg/42ml Lqcr (Chlorpheniramine-hydrocodone) .Marland Kitchen... 1 tsp two times a day as needed cough   Complete Medication List: 1)  Detrol La 4 Mg Cp24 (Tolterodine tartrate) .Marland Kitchen.. 1 by mouth once daily 2)  Ambien 10 Mg Tabs (Zolpidem tartrate) .Marland Kitchen.. 1 by mouth at bedtime 3)  Diclofenac Sodium 50 Mg Tbec (Diclofenac sodium) .... Three times a day as needed pain 4)  Flexeril 10 Mg Tabs (Cyclobenzaprine hcl) .... Three times a day as needed spasm 5)  Seroquel 50 Mg Tabs (Quetiapine fumarate) .... Once daily 6)  Hydrochlorothiazide 25 Mg Tabs (Hydrochlorothiazide) .... Once daily 7)  Zoloft 100 Mg Tabs (Sertraline hcl) .... 2 once daily 8)  Augmentin 875-125 Mg Tabs (Amoxicillin-pot clavulanate) .... Two times a day 9)  Tussionex Pennkinetic Er 8-10 Mg/76ml Lqcr (Chlorpheniramine-hydrocodone) .Marland Kitchen.. 1 tsp two times a day as needed cough   Patient Instructions: 1)  Please schedule a follow-up appointment as needed. Out of work until next week.    Prescriptions: TUSSIONEX PENNKINETIC ER 8-10 MG/5ML LQCR (CHLORPHENIRAMINE-HYDROCODONE) 1 tsp two times a day as needed cough   #200 ml x 0   Entered and Authorized by:   Nelwyn Salisbury MD   Signed by:   Nelwyn Salisbury MD on 11/21/2007   Method used:   Print then Give to Patient   RxID:   [id number] AUGMENTIN 875-125 MG TABS (AMOXICILLIN-POT CLAVULANATE) two times a day  #20 x 0   Entered and Authorized by:   Nelwyn Salisbury MD   Signed by:   Nelwyn Salisbury MD on 11/21/2007   Method used:   Print then Give to Patient   RxID:   [id number]  ]  Medication Administration  Injection # 1:    Medication: Depo- Medrol 80mg     Diagnosis: ACUTE BRONCHITIS (ICD-466.0)    Route: IM    Site: RUOQ gluteus    Exp Date: 9/10    Lot #: [id number]    Mfr: Sicor    Comments: 120mg     Patient tolerated injection without complications    Given by: Alfred Levins, CMA (November 21, 2007 11:27 AM)  Orders Added: 1)  Est. Patient Level IV [28413] 2)  Nebulizer Tx [24401] 3)  Depo- Medrol 80mg  [J1040] 4)  Admin of Therapeutic Inj  intramuscular or subcutaneous [02725]

## 2010-02-24 NOTE — Assessment & Plan Note (Signed)
Summary: PHYSCICAL/CJR   Vital Signs:  Patient profile:   50 year old female Height:      61.5 inches Weight:      187 pounds BMI:     34.89 Temp:     98.5 degrees F oral Pulse rate:   110 / minute BP sitting:   122 / 80  (left arm) Cuff size:   large  Vitals Entered By: Alfred Levins, CMA (August 18, 2008 1:40 PM) CC: cpx, no pap   History of Present Illness: 50 yr old female for cpx. She has a few issues to discuss. First her arthritis pain has worsened, especially in the spine. Diclofenac no longer helps much. Also she has trouble relaxing and winding down at the end of the day, and this hurts her sleep. Ambien does not help much.   Allergies: 1)  ! Codeine  Past History:  Past Medical History: Depression Hypertension sees Dr. Duane Lope for gyn exams overactive bladder, sees Dr[facility] Marcelyn Bruins at [facility]  Past Surgical History: Reviewed history from 01/15/2007 and no changes required. Denies surgical history  Family History: Reviewed history from 01/15/2007 and no changes required. Family History of Alcoholism/Addiction Family History of Arthritis Family History Diabetes 1st degree relative Family History Hypertension Family History of Cardiovascular disorder  Social History: Reviewed history from 01/15/2007 and no changes required. Divorced Current Smoker Alcohol use-no Drug use-no  Review of Systems  The patient denies anorexia, fever, weight loss, weight gain, vision loss, decreased hearing, hoarseness, chest pain, syncope, dyspnea on exertion, peripheral edema, prolonged cough, headaches, hemoptysis, abdominal pain, melena, hematochezia, severe indigestion/heartburn, hematuria, incontinence, genital sores, muscle weakness, suspicious skin lesions, transient blindness, difficulty walking, depression, unusual weight change, abnormal bleeding, enlarged lymph nodes, angioedema, breast masses, and testicular masses.    Physical Exam  General:   overweight-appearing.   Head:  Normocephalic and atraumatic without obvious abnormalities. No apparent alopecia or balding. Eyes:  No corneal or conjunctival inflammation noted. EOMI. Perrla. Funduscopic exam benign, without hemorrhages, exudates or papilledema. Vision grossly normal. Ears:  External ear exam shows no significant lesions or deformities.  Otoscopic examination reveals clear canals, tympanic membranes are intact bilaterally without bulging, retraction, inflammation or discharge. Hearing is grossly normal bilaterally. Nose:  External nasal examination shows no deformity or inflammation. Nasal mucosa are pink and moist without lesions or exudates. Mouth:  Oral mucosa and oropharynx without lesions or exudates.  Teeth in good repair. Neck:  No deformities, masses, or tenderness noted. Chest Wall:  No deformities, masses, or tenderness noted. Lungs:  Normal respiratory effort, chest expands symmetrically. Lungs are clear to auscultation, no crackles or wheezes. Heart:  Normal rate and regular rhythm. S1 and S2 normal without gallop, murmur, click, rub or other extra sounds. Abdomen:  Bowel sounds positive,abdomen soft and non-tender without masses, organomegaly or hernias noted. Msk:  No deformity or scoliosis noted of thoracic or lumbar spine.   Pulses:  R and L carotid,radial,femoral,dorsalis pedis and posterior tibial pulses are full and equal bilaterally Extremities:  No clubbing, cyanosis, edema, or deformity noted with normal full range of motion of all joints.   Neurologic:  No cranial nerve deficits noted. Station and gait are normal. Plantar reflexes are down-going bilaterally. DTRs are symmetrical throughout. Sensory, motor and coordinative functions appear intact. Skin:  Intact without suspicious lesions or rashes Cervical Nodes:  No lymphadenopathy noted Axillary Nodes:  No palpable lymphadenopathy Inguinal Nodes:  No significant adenopathy Psych:  Cognition and judgment  appear intact. Alert and  cooperative with normal attention span and concentration. No apparent delusions, illusions, hallucinations   Impression & Recommendations:  Problem # 1:  HEALTH MAINTENANCE EXAM (ICD-V70.0)  Complete Medication List: 1)  Ambien 10 Mg Tabs (Zolpidem tartrate) .Marland Kitchen.. 1 by mouth at bedtime 2)  Diclofenac Sodium 50 Mg Tbec (Diclofenac sodium) .... Three times a day as needed pain 3)  Flexeril 10 Mg Tabs (Cyclobenzaprine hcl) .... Three times a day as needed spasm 4)  Zoloft 100 Mg Tabs (Sertraline hcl) .... 2 once daily 5)  Toviaz 4 Mg Xr24h-tab (Fesoterodine fumarate) .... Once daily 6)  Celebrex 200 Mg Caps (Celecoxib) .... Two times a day 7)  Alprazolam 2 Mg Tabs (Alprazolam) .... At bedtime  Patient Instructions: 1)  Try Xanax at bedtime and switch to Celebrex for pain.  2)  It is important that you exercise reguarly at least 20 minutes 5 times a week. If you develop chest pain, have severe difficulty breathing, or feel very tired, stop exercising immediately and seek medical attention.  3)  You need to lose weight. Consider a lower calorie diet and regular exercise.  Prescriptions: ALPRAZOLAM 2 MG TABS (ALPRAZOLAM) at bedtime  #30 x 5   Entered and Authorized by:   Nelwyn Salisbury MD   Signed by:   Nelwyn Salisbury MD on 08/18/2008   Method used:   Print then Give to Patient   RxID:   [id number] CELEBREX 200 MG CAPS (CELECOXIB) two times a day  #180 x 3   Entered and Authorized by:   Nelwyn Salisbury MD   Signed by:   Nelwyn Salisbury MD on 08/18/2008   Method used:   Print then Give to Patient   RxID:   [id number]

## 2010-02-24 NOTE — Progress Notes (Signed)
Summary: refill sertraline   Phone Note From Pharmacy   [facility]   Summary of Call: refill sertraline 100mg   # 180 with 1 refill  Initial call taken by: Pura Spice, RN,  October 27, 2009 5:21 PM  Follow-up for Phone Call        done, in your box Follow-up by: Nelwyn Salisbury MD,  October 28, 2009 2:21 PM  Additional Follow-up for Phone Call Additional follow up Details #1::        spoke with Pt she knows RX is ready to be picked up Additional Follow-up by: Kathrynn Speed CMA,  October 28, 2009 3:38 PM    New/Updated Medications: ZOLOFT 100 MG TABS (SERTRALINE HCL) 2 once daily Prescriptions: ZOLOFT 100 MG TABS (SERTRALINE HCL) 2 once daily  #180 x 3   Entered and Authorized by:   Nelwyn Salisbury MD   Signed by:   Nelwyn Salisbury MD on 10/28/2009   Method used:   Print then Give to Patient   RxID:   [id number]

## 2010-02-24 NOTE — Letter (Signed)
Summary: [facility]  [facility]   Imported By: Maryln Gottron 10/19/2008 08:30:40  _____________________________________________________________________  External Attachment:    Type:   Image     Comment:   External Document

## 2010-02-24 NOTE — Progress Notes (Signed)
Summary: [facility]  Phone Note Call from Patient Call back at [phone]   Caller: Patient---live call Summary of Call: pt called and said that her rxs for alprazolam and ambien cannot be mailed by her. They will have to be faxed by our office. please fax and let her know when done. Initial call taken by: Warnell Forester,  January 07, 2010 3:12 PM  Follow-up for Phone Call        if she did not pick these up here, please fax them in to [facility]. If she did pick them up, she needs to bring them here to the office for [place] to fax them  Follow-up by: Nelwyn Salisbury MD,  January 10, 2010 9:21 AM  Additional Follow-up for Phone Call Additional follow up Details #1::        pt notified and wants [facility]  stated she did not pick up rx   Additional Follow-up by: Pura Spice, RN,  January 10, 2010 9:39 AM    Additional Follow-up for Phone Call Additional follow up Details #2::    I printed out new rx for [place] to fax  Follow-up by: Nelwyn Salisbury MD,  January 10, 2010 1:18 PM  Additional Follow-up for Phone Call Additional follow up Details #3:: Details for Additional Follow-up Action Taken: faxed  Additional Follow-up by: Pura Spice, RN,  January 10, 2010 1:55 PM  New/Updated Medications: Sandria Senter ER 10-8 MG/5ML LQCR (HYDROCOD POLST-CHLORPHEN POLST) take one teaspoon two times a day as needed cough Prescriptions: ALPRAZOLAM 2 MG TABS (ALPRAZOLAM) at bedtime  #90 x 1   Entered and Authorized by:   Nelwyn Salisbury MD   Signed by:   Nelwyn Salisbury MD on 01/10/2010   Method used:   Print then Give to Patient   RxID:   [id number] AMBIEN 10 MG TABS (ZOLPIDEM TARTRATE) 1 by mouth at bedtime  #90 x 1   Entered and Authorized by:   Nelwyn Salisbury MD   Signed by:   Nelwyn Salisbury MD on 01/10/2010   Method used:   Print then Give to Patient   RxID:   [id number]

## 2010-02-24 NOTE — Progress Notes (Signed)
Summary: Rx Called to Local Pharmacy (pending mail order)  Phone Note Call from Patient Call back at Home Phone [phone]   Caller: Patient Summary of Call: Needs 14 day supply of generic zoloft sent to [facility] until she receives mail order script. Initial call taken by: Trixie Dredge,  November 05, 2009 10:38 AM  Follow-up for Phone Call        done pt aware. Follow-up by: Pura Spice, RN,  November 05, 2009 11:04 AM    Prescriptions: ZOLOFT 100 MG TABS (SERTRALINE HCL) 2 once daily  #30 x 1   Entered by:   Pura Spice, RN   Authorized by:   Nelwyn Salisbury MD   Signed by:   Pura Spice, RN on 11/05/2009   Method used:   Electronically to        [facility] (retail)       [address]       [place], [place]  [id number]       Ph: [phone]       Fax: [phone]   RxID:   [id number]

## 2010-02-24 NOTE — Progress Notes (Signed)
Summary: rx lost   Phone Note Call from Patient Call back at [phone]   Caller: Patient Call For: dr Virginio Isidore Summary of Call: pt rx was lost by [facility] pt would like for [place] to fax [phone]  diclofenac sodium 50 mg 3x a day as needed for pain. seroquel 50mg , flexeril 10mg  detrol la 4mg , ambien 10mg  and zoloft 100 mg. Initial call taken by: Heron Sabins,  February 05, 2007 4:52 PM  Follow-up for Phone Call        call [facility] and confirm this please. I can't have a year's worth of Ambien floating around. Follow-up by: Nelwyn Salisbury MD,  February 06, 2007 8:58 AM  Additional Follow-up for Phone Call Additional follow up Details #1::        spoke to pt this am and she said she lost the rx's Additional Follow-up by: Alfred Levins, CMA,  February 06, 2007 9:03 AM    Additional Follow-up for Phone Call Additional follow up Details #2::    done, in your box Follow-up by: Nelwyn Salisbury MD,  February 06, 2007 10:56 AM  Additional Follow-up for Phone Call Additional follow up Details #3:: Details for Additional Follow-up Action Taken: faxed to pharmacy Additional Follow-up by: Alfred Levins, CMA,  February 06, 2007 1:23 PM    Prescriptions: SEROQUEL 50 MG  TABS (QUETIAPINE FUMARATE) once daily  #90 x 3   Entered and Authorized by:   Nelwyn Salisbury MD   Signed by:   Nelwyn Salisbury MD on 02/06/2007   Method used:   Print then Give to Patient   RxID:   [id number] FLEXERIL 10 MG  TABS (CYCLOBENZAPRINE HCL) three times a day as needed spasm  #270 x 3   Entered and Authorized by:   Nelwyn Salisbury MD   Signed by:   Nelwyn Salisbury MD on 02/06/2007   Method used:   Print then Give to Patient   RxID:   [id number] DICLOFENAC SODIUM 50 MG  TBEC (DICLOFENAC SODIUM) three times a day as needed pain  #270 x 3   Entered and Authorized by:   Nelwyn Salisbury MD   Signed by:   Nelwyn Salisbury MD on 02/06/2007   Method used:   Print then Give to Patient   RxID:   [id number] ZOLOFT 100  MG  TABS (SERTRALINE HCL) 2 by mouth once daily  #180 x 3   Entered and Authorized by:   Nelwyn Salisbury MD   Signed by:   Nelwyn Salisbury MD on 02/06/2007   Method used:   Print then Give to Patient   RxID:   [id number] AMBIEN 10 MG TABS (ZOLPIDEM TARTRATE) 1 by mouth at bedtime  #90 x 3   Entered and Authorized by:   Nelwyn Salisbury MD   Signed by:   Nelwyn Salisbury MD on 02/06/2007   Method used:   Print then Give to Patient   RxID:   [id number] DETROL LA 4 MG CP24 (TOLTERODINE TARTRATE) 1 by mouth once daily  #90 x 3   Entered and Authorized by:   Nelwyn Salisbury MD   Signed by:   Nelwyn Salisbury MD on 02/06/2007   Method used:   Print then Give to Patient   RxID:   [id number]

## 2010-02-24 NOTE — Progress Notes (Signed)
Summary: Pt req refills on all meds. Pt says she lost her meds  Phone Note Call from Patient Call back at [phone] work   Caller: Patient Summary of Call: Pt called and said that she lost all of her medications that were in a bag. Pt is req refills of zoloft, alprazolam, protonix,promethyzine,ambien,flexiril,toviaz. Please call in to [facility].   Initial call taken by: Lucy Antigua,  March 15, 2009 10:02 AM  Follow-up for Phone Call        please call in 6 months of all these  Follow-up by: Nelwyn Salisbury MD,  March 16, 2009 1:16 PM  Additional Follow-up for Phone Call Additional follow up Details #1::        Pt called back and said that she has found all her meds, but needs a new script for Protonics.  Please call in to [facility] on [address] Additional Follow-up by: Lucy Antigua,  March 16, 2009 4:24 PM    Additional Follow-up for Phone Call Additional follow up Details #2::    rx called in Follow-up by: Alfred Levins, CMA,  March 16, 2009 5:03 PM  Prescriptions: PROTONIX 40 MG TBEC (PANTOPRAZOLE SODIUM) two times a day  #180 x 3   Entered by:   Alfred Levins, CMA   Authorized by:   Nelwyn Salisbury MD   Signed by:   Alfred Levins, CMA on 03/16/2009   Method used:   Electronically to        [id number] (retail)       [address]       [place], [place]  [address]       Ph: [phone]       Fax: [phone]   RxID:   [id number]

## 2010-02-24 NOTE — Progress Notes (Signed)
Summary: pt lost script for Celebrex  Phone Note Call from Patient Call back at [phone] cell   Caller: Patient Call For: Nelwyn Salisbury MD Summary of Call: Pt called in and said that she lost her script that Dr. Clent Ridges had written for Celebrex. Pt would like Dr. Clent Ridges to call this in to [facility]. Their # is [phone]. Initial call taken by: Lucy Antigua,  September 24, 2008 10:45 AM  Follow-up for Phone Call        Phone Call Completed, Rx Called In Follow-up by: Alfred Levins, CMA,  September 24, 2008 11:35 AM    Prescriptions: CELEBREX 200 MG CAPS (CELECOXIB) two times a day  #180 x 3   Entered by:   Alfred Levins, CMA   Authorized by:   Nelwyn Salisbury MD   Signed by:   Alfred Levins, CMA on 09/24/2008   Method used:   Faxed to ...       [address] [facility] (mail-order)       [address]       [place], [place]  [address]       Ph: [phone]       Fax: [phone]   RxID:   [id number]

## 2010-02-24 NOTE — Assessment & Plan Note (Signed)
Summary: gi pain//ccm   Vital Signs:  Patient profile:   50 year old female Weight:      179 pounds Temp:     98.3 degrees F oral Pulse rate:   107 / minute BP sitting:   124 / 88  (left arm) Cuff size:   large  Vitals Entered By: Alfred Levins, CMA (January 28, 2009 10:23 AM) CC: nausea, can't eat x2 wks   History of Present Illness: Here for 2 weeks of mild upper abdominal pains, nausea without vomitting, HAs, and constipation. She has had mild heartburn. No fever. No urinary symptoms. her appetite has been very diminished and she feels weak from nit eating much. She is drinking plenty of water. She has been taking one or two Diclofenac tabs every day for the past year for joint pains. She had been on Protonix, but stopped taking this about 6 months ago.   Current Medications (verified): 1)  Ambien 10 Mg Tabs (Zolpidem Tartrate) .Marland Kitchen.. 1 By Mouth At Bedtime 2)  Diclofenac Sodium 50 Mg  Tbec (Diclofenac Sodium) .... Three Times A Day As Needed Pain 3)  Flexeril 10 Mg  Tabs (Cyclobenzaprine Hcl) .... Three Times A Day As Needed Spasm 4)  Zoloft 100 Mg Tabs (Sertraline Hcl) .... 2 Once Daily 5)  Toviaz 4 Mg Xr24h-Tab (Fesoterodine Fumarate) .... Once Daily 6)  Alprazolam 2 Mg Tabs (Alprazolam) .... At Bedtime  Allergies (verified): 1)  ! Codeine  Past History:  Past Medical History: Depression Hypertension sees Dr. Duane Lope for gyn exams overactive bladder, sees Dr. Marcelyn Bruins at [facility] ([facility]) Osteoarthritis GERD  Past Surgical History: Reviewed history from 01/15/2007 and no changes required. Denies surgical history  Review of Systems  The patient denies fever, weight loss, weight gain, vision loss, decreased hearing, hoarseness, chest pain, syncope, dyspnea on exertion, peripheral edema, prolonged cough, headaches, hemoptysis, melena, hematochezia, severe indigestion/heartburn, hematuria, incontinence, genital sores, muscle weakness, suspicious skin lesions, transient  blindness, difficulty walking, depression, unusual weight change, abnormal bleeding, enlarged lymph nodes, angioedema, breast masses, and testicular masses.    Physical Exam  General:  alert but appears ill Neck:  No deformities, masses, or tenderness noted. Lungs:  Normal respiratory effort, chest expands symmetrically. Lungs are clear to auscultation, no crackles or wheezes. Heart:  Normal rate and regular rhythm. S1 and S2 normal without gallop, murmur, click, rub or other extra sounds. Abdomen:  soft, normal bowel sounds, no distention, no masses, no guarding, no rigidity, no rebound tenderness, no abdominal hernia, no inguinal hernia, no hepatomegaly, and no splenomegaly.  Mildly tender in the epigastrium.    Impression & Recommendations:  Problem # 1:  ABDOMINAL PAIN, EPIGASTRIC (ICD-789.06)  Orders: Radiology Referral (Radiology)  Problem # 2:  GERD (ICD-530.81)  Her updated medication list for this problem includes:    Protonix 40 Mg Tbec (Pantoprazole sodium) .Marland Kitchen..Marland Kitchen Two times a day  Problem # 3:  OSTEOARTHRITIS (ICD-715.90)  The following medications were removed from the medication list:    Diclofenac Sodium 50 Mg Tbec (Diclofenac sodium) .Marland Kitchen... Three times a day as needed pain    Celebrex 200 Mg Caps (Celecoxib) .Marland Kitchen..Marland Kitchen Two times a day  Complete Medication List: 1)  Ambien 10 Mg Tabs (Zolpidem tartrate) .Marland Kitchen.. 1 by mouth at bedtime 2)  Flexeril 10 Mg Tabs (Cyclobenzaprine hcl) .... Three times a day as needed spasm 3)  Zoloft 100 Mg Tabs (Sertraline hcl) .... 2 once daily 4)  Toviaz 4 Mg Xr24h-tab (Fesoterodine fumarate) .... Once daily  5)  Alprazolam 2 Mg Tabs (Alprazolam) .... At bedtime 6)  Protonix 40 Mg Tbec (Pantoprazole sodium) .... Two times a day 7)  Promethazine Hcl 25 Mg Tabs (Promethazine hcl) .Marland Kitchen.. 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 unfortunately she stopped taking her PPI, which  probably could have prevented this. Stop Dicolfenac and use Tylenol for pain, at least for now. Get back on Protonix. Use Phenrergan as needed and eat bland foods. We will get an [place] to rule out gall bladder disease. Wrote her out of work from yresterday until 02-01-09. Foolow up in one week. Prescriptions: PROMETHAZINE HCL 25 MG TABS (PROMETHAZINE HCL) 1 q 4 hours as needed nausea  #60 x 2   Entered and Authorized by:   Nelwyn Salisbury MD   Signed by:   Nelwyn Salisbury MD on 01/28/2009   Method used:   Electronically to        [facility] (retail)       [address]       [place], [place]  [id number]       Ph: [phone]       Fax: [phone]   RxID:   [id number] PROTONIX 40 MG TBEC (PANTOPRAZOLE SODIUM) two times a day  #180 x 3   Entered and Authorized by:   Nelwyn Salisbury MD   Signed by:   Nelwyn Salisbury MD on 01/28/2009   Method used:   Print then Give to Patient   RxID:   [id number]

## 2010-02-24 NOTE — Assessment & Plan Note (Signed)
Summary: SEVERE BACK,SHOULDER AND NECK PAIN/CJR   Vital Signs:  Patient profile:   50 year old female Weight:      183 pounds Temp:     98.7 degrees F oral Pulse rate:   88 / minute Pulse rhythm:   regular BP sitting:   150 / 90  (left arm) Cuff size:   large  Vitals Entered By: Alfred Levins, CMA (March 23, 2009 4:24 PM) CC: neck & back pain. tried heating pad x1 1/2 mos & APAP with no relief. waiting on approval for protonix   History of Present Illness: Here for slowly worsening neck and back pains. We have taken her off all NSAIDs due to her GI problems, and she has dealt with a lot of pain ever since. Tylenol does not help. Also she continues to have constant epigastric pains and heartburn. No N or V. Tried Nexium in the past with no results. Omeprazole OTC does not help. We tried to get her some Protonix, but her insurance wil not cover this.   Current Medications (verified): 1)  Ambien 10 Mg Tabs (Zolpidem Tartrate) .Marland Kitchen.. 1 By Mouth At Bedtime 2)  Flexeril 10 Mg  Tabs (Cyclobenzaprine Hcl) .... Three Times A Day As Needed Spasm 3)  Zoloft 100 Mg Tabs (Sertraline Hcl) .... 2 Once Daily 4)  Toviaz 4 Mg Xr24h-Tab (Fesoterodine Fumarate) .... Once Daily 5)  Alprazolam 2 Mg Tabs (Alprazolam) .... At Bedtime 6)  Protonix 40 Mg Tbec (Pantoprazole Sodium) .... Two Times A Day 7)  Promethazine Hcl 25 Mg Tabs (Promethazine Hcl) .Marland Kitchen.. 1 Q 4 Hours As Needed Nausea  Allergies (verified): 1)  ! Codeine  Past History:  Past Medical History: Reviewed history from 01/28/2009 and no changes required. Depression Hypertension sees Dr. Duane Lope for gyn exams overactive bladder, sees Dr. Marcelyn Bruins at [facility] Osteoarthritis GERD  Past Surgical History: Reviewed history from 01/15/2007 and no changes required. Denies surgical history  Review of Systems  The patient denies anorexia, fever, weight loss, weight gain, vision loss, decreased hearing, hoarseness, chest pain, syncope,  dyspnea on exertion, peripheral edema, prolonged cough, headaches, hemoptysis, melena, hematochezia, severe indigestion/heartburn, hematuria, incontinence, genital sores, muscle weakness, suspicious skin lesions, transient blindness, difficulty walking, depression, unusual weight change, abnormal bleeding, enlarged lymph nodes, angioedema, breast masses, and testicular masses.    Physical Exam  General:  Well-developed,well-nourished,in no acute distress; alert,appropriate and cooperative throughout examination Lungs:  Normal respiratory effort, chest expands symmetrically. Lungs are clear to auscultation, no crackles or wheezes. Heart:  Normal rate and regular rhythm. S1 and S2 normal without gallop, murmur, click, rub or other extra sounds. Abdomen:  Bowel sounds positive,abdomen soft and non-tender without masses, organomegaly or hernias noted.   Impression & Recommendations:  Problem # 1:  ABDOMINAL PAIN, EPIGASTRIC (ICD-789.06)  Orders: Gastroenterology Referral (GI)  Problem # 2:  OSTEOARTHRITIS (ICD-715.90)  Her updated medication list for this problem includes:    Tramadol Hcl 50 Mg Tabs (Tramadol hcl) .Marland Kitchen... 1 or 2 every 6 hours as needed pain  Problem # 3:  GERD (ICD-530.81)  Her updated medication list for this problem includes:    Protonix 40 Mg Tbec (Pantoprazole sodium) .Marland Kitchen..Marland Kitchen Two times a day    Aciphex 20 Mg Tbec (Rabeprazole sodium) .Marland Kitchen..Marland Kitchen Two times a day  Complete Medication List: 1)  Ambien 10 Mg Tabs (Zolpidem tartrate) .Marland Kitchen.. 1 by mouth at bedtime 2)  Flexeril 10 Mg Tabs (Cyclobenzaprine hcl) .... Three times a day as needed spasm 3)  Zoloft 100 Mg Tabs (Sertraline hcl) .... 2 once daily 4)  Toviaz 4 Mg Xr24h-tab (Fesoterodine fumarate) .... Once daily 5)  Alprazolam 2 Mg Tabs (Alprazolam) .... At bedtime 6)  Protonix 40 Mg Tbec (Pantoprazole sodium) .... Two times a day 7)  Promethazine Hcl 25 Mg Tabs (Promethazine hcl) .Marland Kitchen.. 1 q 4 hours as needed nausea 8)   Tramadol Hcl 50 Mg Tabs (Tramadol hcl) .Marland Kitchen.. 1 or 2 every 6 hours as needed pain 9)  Aciphex 20 Mg Tbec (Rabeprazole sodium) .... Two times a day  Patient Instructions: 1)  try samples of Aciphex two times a day . Refer to GI for further evaluation. Try Tramadol for pain. Prescriptions: ALPRAZOLAM 2 MG TABS (ALPRAZOLAM) at bedtime  #90 x 1   Entered and Authorized by:   Nelwyn Salisbury MD   Signed by:   Nelwyn Salisbury MD on 03/23/2009   Method used:   Print then Give to Patient   RxID:   [id number] AMBIEN 10 MG TABS (ZOLPIDEM TARTRATE) 1 by mouth at bedtime  #90 x 1   Entered and Authorized by:   Nelwyn Salisbury MD   Signed by:   Nelwyn Salisbury MD on 03/23/2009   Method used:   Print then Give to Patient   RxID:   [id number] TRAMADOL HCL 50 MG TABS (TRAMADOL HCL) 1 or 2 every 6 hours as needed pain  #120 x 2   Entered and Authorized by:   Nelwyn Salisbury MD   Signed by:   Nelwyn Salisbury MD on 03/23/2009   Method used:   Print then Give to Patient   RxID:   [id number]

## 2010-02-24 NOTE — Assessment & Plan Note (Signed)
Summary: BACK PAIN/CCM  Medications Added DETROL LA 4 MG CP24 (TOLTERODINE TARTRATE) 1 by mouth once daily AMBIEN 10 MG TABS (ZOLPIDEM TARTRATE) 1 by mouth at bedtime DICLOFENAC SODIUM 75 MG TBEC (DICLOFENAC SODIUM) 1 by mouth once daily at bedtime SERTRALINE HCL 50 MG TABS (SERTRALINE HCL) 1 by mouth at bedtime ZOLOFT 100 MG  TABS (SERTRALINE HCL) 2 by mouth once daily DICLOFENAC SODIUM 50 MG  TBEC (DICLOFENAC SODIUM) three times a day as needed pain FLEXERIL 10 MG  TABS (CYCLOBENZAPRINE HCL) three times a day as needed spasm SEROQUEL 50 MG  TABS (QUETIAPINE FUMARATE) once daily      Allergies Added: ! CODEINE  Vital Signs:  Patient Profile:   50 Years Old Female Weight:      196 pounds Temp:     98.8 degrees F oral BP sitting:   122 / 80  (left arm) Cuff size:   large  Vitals Entered By: Alfred Levins, CMA (January 15, 2007 3:30 PM)                 Chief Complaint:  Back Pain.  History of Present Illness: Has a hx of DJD in neck and lower spine, but for the past 2 days has had pain in the center of back. No recent trauma. Heat helps. Ran out of her meds. Using Advil.  Current Allergies: ! CODEINE  Past Medical History:    Depression    Hypertension  Past Surgical History:    Denies surgical history   Family History:    Family History of Alcoholism/Addiction    Family History of Arthritis    Family History Diabetes 1st degree relative    Family History Hypertension    Family History of Cardiovascular disorder  Social History:    Divorced    Current Smoker    Alcohol use-no    Drug use-no   Risk Factors:  Tobacco use:  current Drug use:  no Alcohol use:  no   Review of Systems      See HPI   Physical Exam  General:     Well-developed,well-nourished,in no acute distress; alert,appropriate and cooperative throughout examination Msk:     tender with slight spasm in the thoracic area of spine. 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 1 by mouth once daily 2)  Ambien 10 Mg Tabs (Zolpidem tartrate) .Marland Kitchen.. 1 by mouth at bedtime 3)  Zoloft 100 Mg Tabs (Sertraline hcl) .... 2 by mouth once daily 4)  Diclofenac Sodium 50 Mg Tbec (Diclofenac sodium) .... Three times a day as needed pain 5)  Flexeril 10 Mg Tabs (Cyclobenzaprine hcl) .... Three times a day as needed spasm 6)  Seroquel 50 Mg Tabs (Quetiapine fumarate) .... Once daily   Patient Instructions: 1)  Refilled her meds, including Diclofenac and Flexeril. 2)  Please schedule a follow-up appointment as needed.    Prescriptions: ZOLOFT 100 MG  TABS (SERTRALINE HCL) 2 by mouth once daily  #180 x 3   Entered and Authorized by:   Nelwyn Salisbury MD   Signed by:   Nelwyn Salisbury MD on 01/15/2007   Method used:   Print then Give to Patient   RxID:   [id number] AMBIEN 10 MG TABS (ZOLPIDEM TARTRATE) 1 by mouth at bedtime  #90 x 3   Entered and Authorized by:  Nelwyn Salisbury MD   Signed by:   Nelwyn Salisbury MD on 01/15/2007   Method used:   Print then Give to Patient   RxID:   [id number] DETROL LA 4 MG CP24 (TOLTERODINE TARTRATE) 1 by mouth once daily  #90 x 3   Entered and Authorized by:   Nelwyn Salisbury MD   Signed by:   Nelwyn Salisbury MD on 01/15/2007   Method used:   Print then Give to Patient   RxID:   [id number] SEROQUEL 50 MG  TABS (QUETIAPINE FUMARATE) once daily  #90 x 3   Entered and Authorized by:   Nelwyn Salisbury MD   Signed by:   Nelwyn Salisbury MD on 01/15/2007   Method used:   Print then Give to Patient   RxID:   [id number] FLEXERIL 10 MG  TABS (CYCLOBENZAPRINE HCL) three times a day as needed spasm  #270 x 3   Entered and Authorized by:   Nelwyn Salisbury MD   Signed by:   Nelwyn Salisbury MD on 01/15/2007   Method used:   Print then Give to Patient   RxID:    [id number] DICLOFENAC SODIUM 50 MG  TBEC (DICLOFENAC SODIUM) three times a day as needed pain  #270 x 3   Entered and Authorized by:   Nelwyn Salisbury MD   Signed by:   Nelwyn Salisbury MD on 01/15/2007   Method used:   Print then Give to Patient   RxID:   [id number]  ]

## 2010-02-24 NOTE — Progress Notes (Signed)
Summary: Medication refill   Phone Note Call from Patient Call back at [phone]   Caller: Patient Call For: Dr. Russella Dar Reason for Call: Refill Medication Summary of Call: pt. needs another script for her Moviprep.... Initial call taken by: Karna Christmas,  May 20, 2009 11:23 AM  Follow-up for Phone Call        Pt states her procedure was rescheduled by our office on 04-23-09 to another day but she already mixed up half her prep and now she does not have enough to do the entire prep. We left a  free sample Movi prep up front for pt to pick up. Pt agreed she will pick it up today. Follow-up by: Christie Nottingham CMA Duncan Dull),  May 20, 2009 12:03 PM

## 2010-02-24 NOTE — Progress Notes (Signed)
Summary: Requesting OV, severe cough, chest hurts  Phone Note Call from Patient   Caller: Patient Call For: Clent Ridges Summary of Call: Pt requesting OV today, worried she has bronchitis or pneumonia.  Third day of extreem coughing, chest hurts with deeo cough, denies fever.  Pt has been using an old cough syrup prescribed for her a few years back, with no relief.  Dr Clent Ridges schedule full today, offered OV tomorrow.  Pt declined, will go to Urgent Care if she cannot get in today. [facility] Initial call taken by: Sid Falcon LPN,  November 21, 2007 8:42 AM  Follow-up for Phone Call        I'll see her today, work her in Follow-up by: Nelwyn Salisbury MD,  November 21, 2007 9:08 AM  Additional Follow-up for Phone Call Additional follow up Details #1::        Scheduled OV today 9:45. Additional Follow-up by: Sid Falcon LPN,  November 21, 2007 9:14 AM

## 2010-02-24 NOTE — Letter (Signed)
Summary: Out of Work  [facility]  [address] [place], [place] [address]   Phone: [phone]  Fax: [phone]    April 21, 2009   Employee:  AUDRE CENCI    To Whom It May Concern:   For medical reasons, please excuse the above named employee from work for the following dates:  Start:   April 23, 2009  End:   April 23, 2009  Ms. Joanne Gavel is having an Endoscopic Procedure.  She will be able to return to work on April 26, 2009.  If you need additional information, please feel free to contact our office.       Sincerely,    Francee Piccolo CMA (AAMA)

## 2010-02-24 NOTE — Letter (Signed)
Summary: New Patient letter  [facility]  [address] [place], [place] [address]   Phone: [phone]  Fax: [phone]       03/25/2009 MRN: [id number]  [facility] [address] [place], [place]  [address]  Dear Emily Avery,  Welcome to the Gastroenterology Division at [facility].    You are scheduled to see Dr.  Russella Dar on 04-21-09 at 9:30am on the 3rd floor at [facility], [address]  We ask that you try to arrive at our office 15 minutes prior to your appointment time to allow for check-in.  We would like you to complete the enclosed self-administered evaluation form prior to your visit and bring it with you on the day of your appointment.  We will review it with you.  Also, please bring a complete list of all your medications or, if you prefer, bring the medication bottles and we will list them.  Please bring your insurance card so that we may make a copy of it.  If your insurance requires a referral to see a specialist, please bring your referral form from your primary care physician.  Co-payments are due at the time of your visit and may be paid by cash, check or credit card.     Your office visit will consist of a consult with your physician (includes a physical exam), any laboratory testing he/she may order, scheduling of any necessary diagnostic testing (e.g. x-ray, ultrasound, CT-scan), and scheduling of a procedure (e.g. Endoscopy, Colonoscopy) if required.  Please allow enough time on your schedule to allow for any/all of these possibilities.    If you cannot keep your appointment, please call [phone] to cancel or reschedule prior to your appointment date.  This allows [place] the opportunity to schedule an appointment for another patient in need of care.  If you do not cancel or reschedule by 5 p.m. the business day prior to your appointment date, you will be charged a $50.00 late cancellation/no-show fee.    Thank you for choosing  [facility] for your medical needs.  We appreciate the opportunity to care for you.  Please visit [place] at our website  to learn more about our practice.                     Sincerely,                                                             The Gastroenterology Division

## 2010-02-24 NOTE — Progress Notes (Signed)
Summary: seroquel rx needed  Phone Note Call from Patient Call back at [phone]   Caller: vm 12:13 Call For: Emily Avery/cindy Summary of Call: Seroquel not sent this am & need it sent to [facility] phone [phone], call to get fax number to send Rx. Initial call taken by: Rudy Jew, RN,  March 31, 2008 12:21 PM  Follow-up for Phone Call        call in 50 mg once daily for 6 months Follow-up by: Nelwyn Salisbury MD,  March 31, 2008 1:47 PM  Additional Follow-up for Phone Call Additional follow up Details #1::        Phone Call Completed, Rx Called In Additional Follow-up by: Alfred Levins, CMA,  April 02, 2008 10:14 AM      Prescriptions: SEROQUEL 50 MG  TABS (QUETIAPINE FUMARATE) once daily  #90 x 1   Entered by:   Alfred Levins, CMA   Authorized by:   Nelwyn Salisbury MD   Signed by:   Alfred Levins, CMA on 04/02/2008   Method used:   Faxed to ...       [address] [facility] (mail-order)       [address]       [place], [place]  [address]       Ph: [phone]       Fax: [phone]   RxID:   [id number]

## 2010-02-24 NOTE — Assessment & Plan Note (Signed)
Summary: reck blood pressure/jls   Vital Signs:  Patient Profile:   50 Years Old Female Weight:      192 pounds Temp:     98.3 degrees F oral Pulse rate:   76 / minute Pulse rhythm:   regular BP sitting:   104 / 76  (left arm) Cuff size:   large  Vitals Entered By: Alfred Levins, CMA (April 26, 2007 2:25 PM)                 Chief Complaint:  bp check.  History of Present Illness: Has been feeling well, no side effects to Atenolol.    Current Allergies: ! CODEINE  Past Medical History:    Reviewed history from 01/15/2007 and no changes required:       Depression       Hypertension     Review of Systems  The patient denies anorexia, fever, weight loss, weight gain, vision loss, decreased hearing, hoarseness, chest pain, syncope, dyspnea on exhertion, peripheral edema, prolonged cough, hemoptysis, abdominal pain, melena, hematochezia, severe indigestion/heartburn, hematuria, incontinence, genital sores, muscle weakness, suspicious skin lesions, transient blindness, difficulty walking, depression, unusual weight change, abnormal bleeding, enlarged lymph nodes, angioedema, breast masses, and testicular masses.     Physical Exam  General:     Well-developed,well-nourished,in no acute distress; alert,appropriate and cooperative throughout examination    Impression & Recommendations:  Problem # 1:  HYPERTENSION (ICD-401.9)  Her updated medication list for this problem includes:    Atenolol 50 Mg Tabs (Atenolol) ..... Once daily   Complete Medication List: 1)  Detrol La 4 Mg Cp24 (Tolterodine tartrate) .Marland Kitchen.. 1 by mouth once daily 2)  Ambien 10 Mg Tabs (Zolpidem tartrate) .Marland Kitchen.. 1 by mouth at bedtime 3)  Zoloft 100 Mg Tabs (Sertraline hcl) .... 2 by mouth once daily 4)  Diclofenac Sodium 50 Mg Tbec (Diclofenac sodium) .... Three times a day as needed pain 5)  Flexeril 10 Mg Tabs (Cyclobenzaprine hcl) .... Three times a day as needed spasm 6)  Seroquel 50 Mg Tabs  (Quetiapine fumarate) .... Once daily 7)  Atenolol 50 Mg Tabs (Atenolol) .... Once daily   Patient Instructions: 1)  Please schedule a follow-up appointment in 6 months.    Prescriptions: ATENOLOL 50 MG  TABS (ATENOLOL) once daily  #30 x 0   Entered and Authorized by:   Nelwyn Salisbury MD   Signed by:   Nelwyn Salisbury MD on 04/26/2007   Method used:   Electronically sent to ...       [facility]       [address]       [place], [place]  [address]       Ph: [phone]       Fax: [phone]   RxID:   [id number] ATENOLOL 50 MG  TABS (ATENOLOL) once daily  #90 x 3   Entered and Authorized by:   Nelwyn Salisbury MD   Signed by:   Nelwyn Salisbury MD on 04/26/2007   Method used:   Print then Give to Patient   RxID:   [id number]  ]

## 2010-02-24 NOTE — Assessment & Plan Note (Signed)
Summary: RENEW MEDS/BP CHECK/CCM   Vital Signs:  Patient Profile:   50 Years Old Female Weight:      192 pounds Temp:     98.3 degrees F oral Pulse rate:   121 / minute BP sitting:   100 / 72  (left arm) Cuff size:   large  Vitals Entered By: Alfred Levins, CMA (August 09, 2007 2:51 PM)                 Chief Complaint:  renew meds.  History of Present Illness: Here to recheck BP and to discuss Zoloft. She feels as if the Zoloft is not working anymore. She gets very irritable and anxious. Also she stays tired al the time, very sluggish. She had stopped her Atenolol some months ago.     Current Allergies: ! CODEINE  Past Medical History:    Reviewed history from 01/15/2007 and no changes required:       Depression       Hypertension     Review of Systems  The patient denies anorexia, fever, weight loss, weight gain, vision loss, decreased hearing, hoarseness, chest pain, syncope, dyspnea on exertion, peripheral edema, prolonged cough, headaches, hemoptysis, abdominal pain, melena, hematochezia, severe indigestion/heartburn, hematuria, incontinence, genital sores, muscle weakness, suspicious skin lesions, transient blindness, difficulty walking, depression, unusual weight change, abnormal bleeding, enlarged lymph nodes, angioedema, breast masses, and testicular masses.     Physical Exam  General:     overweight-appearing.      Impression & Recommendations:  Problem # 1:  HYPERTENSION (ICD-401.9)  The following medications were removed from the medication list:    Lisinopril 10 Mg Tabs (Lisinopril) .Marland Kitchen... Take 1 tab by mouth daily   Problem # 2:  DEPRESSION (ICD-311)  The following medications were removed from the medication list:    Zoloft 100 Mg Tabs (Sertraline hcl) .Marland Kitchen... 2 by mouth once daily  Her updated medication list for this problem includes:    Cymbalta 60 Mg Cpep (Duloxetine hcl) ..... Once daily   Complete Medication List: 1)  Detrol La 4 Mg  Cp24 (Tolterodine tartrate) .Marland Kitchen.. 1 by mouth once daily 2)  Ambien 10 Mg Tabs (Zolpidem tartrate) .Marland Kitchen.. 1 by mouth at bedtime 3)  Diclofenac Sodium 50 Mg Tbec (Diclofenac sodium) .... Three times a day as needed pain 4)  Flexeril 10 Mg Tabs (Cyclobenzaprine hcl) .... Three times a day as needed spasm 5)  Seroquel 50 Mg Tabs (Quetiapine fumarate) .... Once daily 6)  Cymbalta 60 Mg Cpep (Duloxetine hcl) .... Once daily   Patient Instructions: 1)  Stop all BP meds. Switch  to Cymbalta. 2)  Please schedule a follow-up appointment in 1 month.   Prescriptions: CYMBALTA 60 MG  CPEP (DULOXETINE HCL) once daily  #28 x 0   Entered and Authorized by:   Nelwyn Salisbury MD   Signed by:   Nelwyn Salisbury MD on 08/09/2007   Method used:   Samples Given   RxID:   [id number]  ]

## 2010-02-24 NOTE — Assessment & Plan Note (Signed)
Summary: ROA/FUP/RCD pt rsc/njr   Vital Signs:  Patient profile:   50 year old female Weight:      185 pounds Temp:     97.8 degrees F oral Pulse rate:   99 / minute BP sitting:   124 / 72  (left arm) Cuff size:   large  Vitals Entered By: Alfred Levins, CMA (February 08, 2009 8:40 AM) CC: discuss u/s results   History of Present Illness: Here to follow up on upper abdominal pain which we felt was due to duodenitis or an ulcer. For the past 10 days she has been off all NSAIDs and has been taking Protonix two times a day . She feels much better now with only slight epigastric dicomfort. The pain and nausea are all gone, her appetite is normal, and her BMs are normal.  Current Medications (verified): 1)  Ambien 10 Mg Tabs (Zolpidem Tartrate) .Marland Kitchen.. 1 By Mouth At Bedtime 2)  Flexeril 10 Mg  Tabs (Cyclobenzaprine Hcl) .... Three Times A Day As Needed Spasm 3)  Zoloft 100 Mg Tabs (Sertraline Hcl) .... 2 Once Daily 4)  Toviaz 4 Mg Xr24h-Tab (Fesoterodine Fumarate) .... Once Daily 5)  Alprazolam 2 Mg Tabs (Alprazolam) .... At Bedtime 6)  Protonix 40 Mg Tbec (Pantoprazole Sodium) .... Two Times A Day 7)  Promethazine Hcl 25 Mg Tabs (Promethazine Hcl) .Marland Kitchen.. 1 Q 4 Hours As Needed Nausea  Allergies (verified): 1)  ! Codeine  Past History:  Past Medical History: Reviewed history from 01/28/2009 and no changes required. Depression Hypertension sees Dr. Duane Lope for gyn exams overactive bladder, sees Dr. Marcelyn Bruins at [facility] Osteoarthritis GERD  Review of Systems  The patient denies anorexia, fever, weight loss, weight gain, vision loss, decreased hearing, hoarseness, chest pain, syncope, dyspnea on exertion, peripheral edema, prolonged cough, headaches, hemoptysis, melena, hematochezia, severe indigestion/heartburn, hematuria, incontinence, genital sores, muscle weakness, suspicious skin lesions, transient blindness, difficulty walking, depression, unusual weight change, abnormal  bleeding, enlarged lymph nodes, angioedema, breast masses, and testicular masses.    Physical Exam  General:  Well-developed,well-nourished,in no acute distress; alert,appropriate and cooperative throughout examination Abdomen:  Bowel sounds positive,abdomen soft and non-tender without masses, organomegaly or hernias noted.   Impression & Recommendations:  Problem # 1:  ABDOMINAL PAIN, EPIGASTRIC (ICD-789.06)  Problem # 2:  GERD (ICD-530.81)  Her updated medication list for this problem includes:    Protonix 40 Mg Tbec (Pantoprazole sodium) .Marland Kitchen..Marland Kitchen Two times a day  Complete Medication List: 1)  Ambien 10 Mg Tabs (Zolpidem tartrate) .Marland Kitchen.. 1 by mouth at bedtime 2)  Flexeril 10 Mg Tabs (Cyclobenzaprine hcl) .... Three times a day as needed spasm 3)  Zoloft 100 Mg Tabs (Sertraline hcl) .... 2 once daily 4)  Toviaz 4 Mg Xr24h-tab (Fesoterodine fumarate) .... Once daily 5)  Alprazolam 2 Mg Tabs (Alprazolam) .... At bedtime 6)  Protonix 40 Mg Tbec (Pantoprazole sodium) .... Two times a day 7)  Promethazine Hcl 25 Mg Tabs (Promethazine hcl) .Marland Kitchen.. 1 q 4 hours as needed nausea  Other Orders: Admin 1st Vaccine (04540) Flu Vaccine 26yrs + (98119) Flu Vaccine Consent Questions     Do you have a history of severe allergic reactions to this vaccine? no    Any prior history of allergic reactions to egg and/or gelatin? no    Do you have a sensitivity to the preservative Thimersol? no    Do you have a past history of Guillan-Barre Syndrome? no    Do you currently have an acute  febrile illness? no    Have you ever had a severe reaction to latex? no    Vaccine information given and explained to patient? yes    Are you currently pregnant? no    Lot Number:[id number]   Exp Date:07/22/2009   Site Given  Left Deltoid IM  Patient Instructions: 1)  Conitnue on Protonix two times a day for 2 more weeks, then cut it back to once daily.  2)  Please schedule a follow-up appointment as needed .    .lbflu

## 2010-02-24 NOTE — Progress Notes (Signed)
Summary: refill zolpidem  Phone Note From Pharmacy   Caller: [employer] Call For: Emily Avery  Summary of Call: refill zolpidem 10mg  1 by mouth at bedtime Initial call taken by: Alfred Levins, CMA,  September 29, 2008 11:04 AM  Follow-up for Phone Call        call in #30 with 5 rf Follow-up by: Nelwyn Salisbury MD,  September 29, 2008 5:24 PM  Additional Follow-up for Phone Call Additional follow up Details #1::        Phone call completed, Pharmacist called Additional Follow-up by: Alfred Levins, CMA,  September 29, 2008 5:26 PM    Prescriptions: AMBIEN 10 MG TABS (ZOLPIDEM TARTRATE) 1 by mouth at bedtime  #90 x 1   Entered by:   Alfred Levins, CMA   Authorized by:   Nelwyn Salisbury MD   Signed by:   Alfred Levins, CMA on 09/29/2008   Method used:   Telephoned to ...       [address] [facility] (mail-order)       [address]       [place], [place]  [address]       Ph: [phone]       Fax: [phone]   RxID:   [id number]

## 2010-02-24 NOTE — Assessment & Plan Note (Signed)
Summary: consult re: memory issues/cjr   Vital Signs:  Patient profile:   50 year old female Weight:      190 pounds Temp:     98.6 degrees F oral Pulse rate:   93 / minute BP sitting:   134 / 80  (left arm) Cuff size:   large  Vitals Entered By: Alfred Levins, CMA (February 07, 2010 2:32 PM) CC: memory issues   History of Present Illness: Here to discuss what she originally felt are "memory problems" but which turned out to be attentions issues. She describes forgetting little things throughout the day like losing her trian of thought during conversations, or intending to do something and then forgetting to do it. She tends to start many things and move to something else before she finishes the firtst task. She gets anxous at times and feels overwhelmed when she has sevral things to do at the same time. I asked her to tell me exactly what she had for dineer last night, what she did, what she watched on TV, etc. and she could remember every detail clearly. Her 3 object recall after 5 minutes today was excellent. She says she had trouble as a child in school completing projects on time, and her teachers called her a "dreamer" since she tended to daydream in class. She denies any depression curently, and she sleeps well as long as she takes Ambien. She has been on her current medication regimen for several years now.   Current Medications (verified): 1)  Ambien 10 Mg Tabs (Zolpidem Tartrate) .Marland Kitchen.. 1 By Mouth At Bedtime 2)  Flexeril 10 Mg  Tabs (Cyclobenzaprine Hcl) .... Three Times A Day As Needed Spasm 3)  Zoloft 100 Mg Tabs (Sertraline Hcl) .... 2 Once Daily 4)  Alprazolam 2 Mg Tabs (Alprazolam) .... At Bedtime  Allergies (verified): 1)  ! Codeine  Past History:  Past Medical History: Reviewed history from 01/28/2009 and no changes required. Depression Hypertension sees Dr. Duane Lope for gyn exams overactive bladder, sees Dr. Marcelyn Bruins at [facility] Osteoarthritis GERD  Review of  Systems  The patient denies anorexia, fever, weight loss, weight gain, vision loss, decreased hearing, hoarseness, chest pain, syncope, dyspnea on exertion, peripheral edema, prolonged cough, headaches, hemoptysis, abdominal pain, melena, hematochezia, severe indigestion/heartburn, hematuria, incontinence, genital sores, muscle weakness, suspicious skin lesions, transient blindness, difficulty walking, depression, unusual weight change, abnormal bleeding, enlarged lymph nodes, angioedema, breast masses, and testicular masses.    Physical Exam  General:  Well-developed,well-nourished,in no acute distress; alert,appropriate and cooperative throughout examination Neurologic:  No cranial nerve deficits noted. Station and gait are normal. Plantar reflexes are down-going bilaterally. DTRs are symmetrical throughout. Sensory, motor and coordinative functions appear intact. Psych:  Cognition and judgment appear intact. Alert and cooperative with normal attention span and concentration. No apparent delusions, illusions, hallucinations   Impression & Recommendations:  Problem # 1:  ADHD (ICD-314.01)  Complete Medication List: 1)  Ambien 10 Mg Tabs (Zolpidem tartrate) .Marland Kitchen.. 1 by mouth at bedtime 2)  Flexeril 10 Mg Tabs (Cyclobenzaprine hcl) .... Three times a day as needed spasm 3)  Zoloft 100 Mg Tabs (Sertraline hcl) .... 2 once daily 4)  Alprazolam 2 Mg Tabs (Alprazolam) .... At bedtime 5)  Ritalin La 20 Mg Xr24h-cap (Methylphenidate hcl) .... Once daily  Patient Instructions: 1)  I think she is struggling with attention deficit issues and not memory issues. We will try Ritalin LA.  2)  Please schedule a follow-up appointment in 1 month.  Prescriptions: RITALIN LA 20 MG XR24H-CAP (METHYLPHENIDATE HCL) once daily  #30 x 0   Entered and Authorized by:   Nelwyn Salisbury MD   Signed by:   Nelwyn Salisbury MD on 02/07/2010   Method used:   Print then Give to Patient   RxID:   [id number]    Orders  Added: 1)  Est. Patient Level IV [14782]

## 2010-02-24 NOTE — Assessment & Plan Note (Signed)
Summary: bronchitis/dlo   Vital Signs:  Patient profile:   50 year old female Weight:      178 pounds BMI:     33.21 Temp:     98.7 degrees F Pulse rate:   100 / minute BP sitting:   130 / 70  (left arm) Cuff size:   regular  Vitals Entered By: Lamar Sprinkles, CMA (December 25, 2009 10:44 AM) CC: Cough x 2 days, worse last night, productive w/dk mucus Comments Pt is not taking promethazine, tramadol, omeprazole, glycopyrrolate or align./SD   History of Present Illness: CC: bronchitis?  2 d h/o productive cough of yellow sputum.  + congestion and RN.  mild HA this AM.  + PNdrip and drainage.  + subjective chills.  Tried nyquil for cough, didn't help.  Cough waking her up some from sleep.  Feels like able to cough up sputum.  No sinus pressure, abd pain, n/v, rashes, myalgias/arthralgia changes.  No fevers.  No other sick contacts, no h/o asthma.  h/o bronchitis in past.  Smoking <1 ppd (30 years), less while sick  h/o codeine allergy but able to take tussionex.  Current Medications (verified): 1)  Ambien 10 Mg Tabs (Zolpidem Tartrate) .Marland Kitchen.. 1 By Mouth At Bedtime 2)  Flexeril 10 Mg  Tabs (Cyclobenzaprine Hcl) .... Three Times A Day As Needed Spasm 3)  Zoloft 100 Mg Tabs (Sertraline Hcl) .... 2 Once Daily 4)  Alprazolam 2 Mg Tabs (Alprazolam) .... At Bedtime 5)  Omeprazole 40 Mg Cpdr (Omeprazole) .... Take 1 Tablet By Mouth Two Times A Day 6)  Promethazine Hcl 25 Mg Tabs (Promethazine Hcl) .Marland Kitchen.. 1 Q 4 Hours As Needed Nausea 7)  Tramadol Hcl 50 Mg Tabs (Tramadol Hcl) .Marland Kitchen.. 1 or 2 Every 6 Hours As Needed Pain 8)  Glycopyrrolate 1 Mg Tabs (Glycopyrrolate) .... Take 1 Tablet By Mouth Two Times A Day 9)  Align  Caps (Probiotic Product) .... Take 1 Tablet By Mouth Once A Day X 2 Months. Patient To Get Otc  Allergies (verified): 1)  ! Codeine  Past History:  Past Medical History: Last updated: 01/28/2009 Depression Hypertension sees Dr. Duane Lope for gyn exams overactive  bladder, sees Dr. Marcelyn Bruins at [facility] Osteoarthritis GERD  Social History: Last updated: 04/21/2009 Divorced Current Smoker Alcohol use-no Drug use-no Daily Caffeine Use 3-4  Review of Systems       per HPI  Physical Exam  General:  Well-developed,well-nourished,in no acute distress; alert,appropriate and cooperative throughout examination Head:  Normocephalic and atraumatic without obvious abnormalities. No apparent alopecia or balding.  sinuses NT Eyes:  No corneal or conjunctival inflammation noted. EOMI. Perrla.  Ears:  TMs clear bilaterally Nose:  nares slight congestion bilaterally Mouth:  mild pharyngeal erythema.  no exudates Neck:  No deformities, masses, or tenderness noted.  no LAD Lungs:  somewhat coarse breath sounds, no wheezing or crackles/rales appreciated. Heart:  Normal rate and regular rhythm. S1 and S2 normal without gallop, murmur, click, rub or other extra sounds. Pulses:  2+ rad pulses Extremities:  No clubbing, cyanosis, edema, or deformity noted with normal full range of motion of all joints.   Skin:  Intact without suspicious lesions or rashes   Impression & Recommendations:  Problem # 1:  ACUTE BRONCHITIS (ICD-466.0)  predisposed 2/2 smoking.  treat supportively with rest, fluid, tussionex.  Zpack given smoker, possible early bronchitis.  Rec f/u with Dr. Clent Ridges about smoking cessation if interested.  Her updated medication list for this problem includes:  Tussionex Pennkinetic Er 10-8 Mg/64ml Lqcr (Hydrocod polst-chlorphen polst) .Marland Kitchen... Take one teaspoon two times a day as needed cough    Zithromax Z-pak 250 Mg Tabs (Azithromycin) ..... Use as directed  Complete Medication List: 1)  Ambien 10 Mg Tabs (Zolpidem tartrate) .Marland Kitchen.. 1 by mouth at bedtime 2)  Flexeril 10 Mg Tabs (Cyclobenzaprine hcl) .... Three times a day as needed spasm 3)  Zoloft 100 Mg Tabs (Sertraline hcl) .... 2 once daily 4)  Alprazolam 2 Mg Tabs (Alprazolam) .... At  bedtime 5)  Omeprazole 40 Mg Cpdr (Omeprazole) .... Take 1 tablet by mouth two times a day 6)  Promethazine Hcl 25 Mg Tabs (Promethazine hcl) .Marland Kitchen.. 1 q 4 hours as needed nausea 7)  Tramadol Hcl 50 Mg Tabs (Tramadol hcl) .Marland Kitchen.. 1 or 2 every 6 hours as needed pain 8)  Glycopyrrolate 1 Mg Tabs (Glycopyrrolate) .... Take 1 tablet by mouth two times a day 9)  Align Caps (Probiotic product) .... Take 1 tablet by mouth once a day x 2 months. patient to get otc 10)  Tussionex Pennkinetic Er 10-8 Mg/6ml Lqcr (Hydrocod polst-chlorphen polst) .... Take one teaspoon two times a day as needed cough 11)  Zithromax Z-pak 250 Mg Tabs (Azithromycin) .... Use as directed  Patient Instructions: 1)  Sounds like you have a bronchitis.   2)  Use medication as prescribed: zpack and tussionex for cough. 3)  Push fluids and plenty of water. 4)  Recommendedto cut back and quit smoking to prevent from getting these infections and further damage to your lungs. 5)  Please return if you are not improving as expected, or if you have high fevers (>101.5) or difficulty swallowing. 6)  Call clinic with questions.  Pleasure to see you today!  Prescriptions: ZITHROMAX Z-PAK 250 MG TABS (AZITHROMYCIN) use as directed  #1 x 0   Entered and Authorized by:   Eustaquio Boyden  MD   Signed by:   Eustaquio Boyden  MD on 12/25/2009   Method used:   Print then Give to Patient   RxID:   [id number] Sandria Senter ER 10-8 MG/5ML LQCR (HYDROCOD POLST-CHLORPHEN POLST) take one teaspoon two times a day as needed cough  #100cc x 0   Entered and Authorized by:   Eustaquio Boyden  MD   Signed by:   Eustaquio Boyden  MD on 12/25/2009   Method used:   Print then Give to Patient   RxID:   [id number]    Orders Added: 1)  Est. Patient Level III [40102]

## 2010-02-24 NOTE — Progress Notes (Signed)
Summary: just saw you a week and half ago please call in cough med  Phone Note Call from Patient Call back at Home Phone [phone] Call back at Work Phone [phone] Call back at work till 5   Caller: vm Call For: Emily Avery Summary of Call: Requesting cough med for bronchitis that I get twice yearly to [facility] Initial call taken by: Rudy Jew, RN,  November 03, 2008 12:23 PM  Follow-up for Phone Call        I can't call in narcotic meds without seeing her   Follow-up by: Nelwyn Salisbury MD,  November 03, 2008 1:07 PM  Additional Follow-up for Phone Call Additional follow up Details #1::        Says she just saw you a week and half ago.  Please do.   Additional Follow-up by: Rudy Jew, RN,  November 03, 2008 3:45 PM    Additional Follow-up for Phone Call Additional follow up Details #2::    call in Tussionex, 1 tsp two times a day as needed cough, 200 ml, no rf Follow-up by: Nelwyn Salisbury MD,  November 03, 2008 5:01 PM  Additional Follow-up for Phone Call Additional follow up Details #3:: Details for Additional Follow-up Action Taken: rx called in, pt aware Additional Follow-up by: Alfred Levins, CMA,  November 03, 2008 5:06 PM

## 2010-02-24 NOTE — Assessment & Plan Note (Signed)
Summary: having hot flashes/jls   Vital Signs:  Patient Profile:   50 Years Old Female Weight:      196 pounds Temp:     98.3 degrees F oral Pulse rate:   100 / minute BP sitting:   134 / 84  (left arm) Cuff size:   large  Vitals Entered By: Alfred Levins, CMA (October 29, 2007 2:04 PM)                 Chief Complaint:  hot flashes.  History of Present Illness: Here to discuss sweats and hot flashes. these have been going on for several years but are worse lately. Had a gyn exam with Dr. Duane Lope a year ago. Wants to switch back from Cymbalta to Zoloft because Cymbalta makes her dizzy.     Current Allergies: ! CODEINE  Past Medical History:    Reviewed history from 01/15/2007 and no changes required:       Depression       Hypertension       sees Dr. Duane Lope for gyn exams  Past Surgical History:    Reviewed history from 01/15/2007 and no changes required:       Denies surgical history     Review of Systems  The patient denies anorexia, fever, weight loss, weight gain, vision loss, decreased hearing, hoarseness, chest pain, syncope, dyspnea on exertion, peripheral edema, prolonged cough, headaches, hemoptysis, abdominal pain, melena, hematochezia, severe indigestion/heartburn, hematuria, incontinence, genital sores, muscle weakness, suspicious skin lesions, transient blindness, difficulty walking, depression, unusual weight change, abnormal bleeding, enlarged lymph nodes, angioedema, breast masses, and testicular masses.     Physical Exam  General:     Well-developed,well-nourished,in no acute distress; alert,appropriate and cooperative throughout examination    Impression & Recommendations:  Problem # 1:  MENOPAUSAL SYNDROME (ICD-627.2)  Problem # 2:  HYPERTENSION (ICD-401.9)  Her updated medication list for this problem includes:    Hydrochlorothiazide 25 Mg Tabs (Hydrochlorothiazide) ..... Once daily   Problem # 3:  DEPRESSION (ICD-311)  The  following medications were removed from the medication list:    Cymbalta 30 Mg Cpep (Duloxetine hcl) .Marland KitchenMarland KitchenMarland KitchenMarland Kitchen 3 once daily  Her updated medication list for this problem includes:    Zoloft 100 Mg Tabs (Sertraline hcl) .Marland Kitchen... 2 once daily   Complete Medication List: 1)  Detrol La 4 Mg Cp24 (Tolterodine tartrate) .Marland Kitchen.. 1 by mouth once daily 2)  Ambien 10 Mg Tabs (Zolpidem tartrate) .Marland Kitchen.. 1 by mouth at bedtime 3)  Diclofenac Sodium 50 Mg Tbec (Diclofenac sodium) .... Three times a day as needed pain 4)  Flexeril 10 Mg Tabs (Cyclobenzaprine hcl) .... Three times a day as needed spasm 5)  Seroquel 50 Mg Tabs (Quetiapine fumarate) .... Once daily 6)  Hydrochlorothiazide 25 Mg Tabs (Hydrochlorothiazide) .... Once daily 7)  Zoloft 100 Mg Tabs (Sertraline hcl) .... 2 once daily   Patient Instructions: 1)  Try OTC black cohosh and B complex vitamins. If this does not help, contact Dr. Tenny Craw. switch back to Zoloft.   Prescriptions: ZOLOFT 100 MG TABS (SERTRALINE HCL) 2 once daily  #180 x 3   Entered and Authorized by:   Nelwyn Salisbury MD   Signed by:   Nelwyn Salisbury MD on 10/29/2007   Method used:   Print then Give to Patient   RxID:   [id number]  ]

## 2010-02-24 NOTE — Letter (Signed)
Summary: Lipid Letter  [place] [facility]  [address] [place], [place] [address]   Phone: [phone]  Fax: [phone]    08/12/2008  Emily Avery [address] [place], [place]  [address]  Dear Emily Avery:  We have carefully reviewed your last lipid profile from  and the results are noted below with a summary of recommendations for lipid management.    Cholesterol:       208     Goal: <   HDL "good" Cholesterol:   57.84     Goal: >   LDL "bad" Cholesterol:       Goal: <   Triglycerides:       169.0     Goal: <    Labs are normal but, triglycerides and cholesterol are high.  Please watch your diet.     TLC Diet (Therapeutic Lifestyle Change): Saturated Fats & Transfatty acids should be kept < 7% of total calories ***Reduce Saturated Fats Polyunstaurated Fat can be up to 10% of total calories Monounsaturated Fat Fat can be up to 20% of total calories Total Fat should be no greater than 25-35% of total calories Carbohydrates should be 50-60% of total calories Protein should be approximately 15% of total calories Fiber should be at least 20-30 grams a day ***Increased fiber may help lower LDL Total Cholesterol should be < 200mg /day Consider adding plant stanol/sterols to diet (example: Benacol spread) ***A higher intake of unsaturated fat may reduce Triglycerides and Increase HDL    Adjunctive Measures (may lower LIPIDS and reduce risk of Heart Attack) include: Aerobic Exercise (20-30 minutes 3-4 times a week) Limit Alcohol Consumption Weight Reduction Aspirin 75-81 mg a day by mouth (if not allergic or contraindicated) Dietary Fiber 20-30 grams a day by mouth  If you have any questions, please call. We appreciate being able to work with you.   Sincerely,     at Reginold Agent MD

## 2010-03-16 ENCOUNTER — Other Ambulatory Visit: Payer: Self-pay

## 2010-03-16 MED ORDER — SERTRALINE HCL 100 MG PO TABS: 200.0000 mg | ORAL_TABLET | Freq: Every day | ORAL | Status: DC

## 2010-03-16 MED ORDER — METHYLPHENIDATE HCL ER (LA) 20 MG PO CP24: 20.0000 mg | ORAL_CAPSULE | Freq: Every day | ORAL | Status: DC

## 2010-03-16 NOTE — Telephone Encounter (Signed)
Pt called stated she lost her rx for setraline and [facility] script wont fill til march 9. Requesting enough til then and wants rx for her ritalin  Call when ready at [phone]

## 2010-03-16 NOTE — Telephone Encounter (Signed)
Pt would like dr fry to call her today.

## 2010-03-16 NOTE — Telephone Encounter (Signed)
Pt aware rx ready for pick up. 

## 2010-03-16 NOTE — Telephone Encounter (Signed)
Done, in your box

## 2010-04-08 ENCOUNTER — Other Ambulatory Visit: Payer: Self-pay | Admitting: Obstetrics and Gynecology

## 2010-04-18 ENCOUNTER — Other Ambulatory Visit: Payer: Self-pay | Admitting: Obstetrics and Gynecology

## 2010-04-18 DIAGNOSIS — R928 Other abnormal and inconclusive findings on diagnostic imaging of breast: Secondary | ICD-10-CM

## 2010-04-25 ENCOUNTER — Ambulatory Visit
Admission: RE | Admit: 2010-04-25 | Discharge: 2010-04-25 | Disposition: A | Discharge status: Home / Self Care | Payer: BC Managed Care – PPO | Source: Ambulatory Visit | Attending: Obstetrics and Gynecology | Admitting: Obstetrics and Gynecology

## 2010-04-25 DIAGNOSIS — R928 Other abnormal and inconclusive findings on diagnostic imaging of breast: Secondary | ICD-10-CM

## 2010-05-11 ENCOUNTER — Other Ambulatory Visit: Payer: Self-pay | Admitting: Family Medicine

## 2010-05-11 MED ORDER — METHYLPHENIDATE HCL ER (LA) 20 MG PO CP24: 20.0000 mg | ORAL_CAPSULE | Freq: Every day | ORAL | Status: DC

## 2010-05-11 NOTE — Telephone Encounter (Signed)
Pt called req script for generic Ritalin LA 20 mg.

## 2010-05-11 NOTE — Telephone Encounter (Signed)
done

## 2010-05-11 NOTE — Telephone Encounter (Signed)
Pt aware rx ready for pick up. 

## 2010-05-24 ENCOUNTER — Ambulatory Visit (INDEPENDENT_AMBULATORY_CARE_PROVIDER_SITE_OTHER): Payer: BC Managed Care – PPO | Admitting: Family Medicine

## 2010-05-24 ENCOUNTER — Encounter: Payer: Self-pay | Admitting: Family Medicine

## 2010-05-24 VITALS — BP 122/72 | HR 90 | Temp 98.6°F | Resp 16 | Wt 193.6 lb

## 2010-05-24 DIAGNOSIS — M129 Arthropathy, unspecified: Secondary | ICD-10-CM

## 2010-05-24 DIAGNOSIS — M1990 Unspecified osteoarthritis, unspecified site: Secondary | ICD-10-CM

## 2010-05-24 MED ORDER — METHYLPREDNISOLONE ACETATE 80 MG/ML IJ SUSP
120.0000 mg | Freq: Once | INTRAMUSCULAR | Status: AC
  Administered 2010-05-24: 120 mg via INTRAMUSCULAR

## 2010-05-24 NOTE — Progress Notes (Signed)
  Subjective:    Patient ID: Emily Avery, female    DOB: September 21, 1960, 50 y.o.   MRN: [id number]  HPI Here for 2 days of swelling and pain in the right index finger with no recent trauma. This has never happened before. She has some osteoarthritis is several locations, including the hips and knees. Motrin helps a little. Ice do not help. No other joint swellings or fever or rashes.    Review of Systems  Constitutional: Negative.   Respiratory: Negative.   Cardiovascular: Negative.   Musculoskeletal: Positive for joint swelling and arthralgias. Negative for myalgias, back pain and gait problem.       Objective:   Physical Exam  Constitutional: She appears well-developed and well-nourished.  Musculoskeletal:       The right index finger is mildly swollen along the entire length of the finger. ROM is reduced a bit. The DIP is extremely tender. No erythema or warmth          Assessment & Plan:  Sudden onset of finger pain consistent with an inflammatory arthritis, possibly gout. Get labs. Try a steroid shot.

## 2010-05-25 LAB — CBC WITH DIFFERENTIAL/PLATELET
Basophils Absolute: 0.1 10*3/uL (ref 0.0–0.1)
Basophils Relative: 0.5 % (ref 0.0–3.0)
Eosinophils Absolute: 0.1 10*3/uL (ref 0.0–0.7)
Eosinophils Relative: 0.9 % (ref 0.0–5.0)
HCT: 41.2 % (ref 36.0–46.0)
Hemoglobin: 13.7 g/dL (ref 12.0–15.0)
Lymphocytes Relative: 33.4 % (ref 12.0–46.0)
Lymphs Abs: 3.7 10*3/uL (ref 0.7–4.0)
MCHC: 33.2 g/dL (ref 30.0–36.0)
MCV: 94.3 fl (ref 78.0–100.0)
Monocytes Absolute: 0.6 10*3/uL (ref 0.1–1.0)
Monocytes Relative: 5.4 % (ref 3.0–12.0)
Neutro Abs: 6.5 10*3/uL (ref 1.4–7.7)
Neutrophils Relative %: 59.8 % (ref 43.0–77.0)
Platelets: 364 10*3/uL (ref 150.0–400.0)
RBC: 4.37 Mil/uL (ref 3.87–5.11)
RDW: 12.9 % (ref 11.5–14.6)
WBC: 10.9 10*3/uL — ABNORMAL HIGH (ref 4.5–10.5)

## 2010-05-25 LAB — BASIC METABOLIC PANEL
BUN: 11 mg/dL (ref 6–23)
CO2: 26 mEq/L (ref 19–32)
Calcium: 9.1 mg/dL (ref 8.4–10.5)
Chloride: 102 mEq/L (ref 96–112)
Creatinine, Ser: 0.7 mg/dL (ref 0.4–1.2)
GFR: 101.6 mL/min (ref >60.00)
Glucose, Bld: 82 mg/dL (ref 70–99)
Potassium: 4.5 mEq/L (ref 3.5–5.1)
Sodium: 137 mEq/L (ref 135–145)

## 2010-05-25 LAB — URIC ACID: Uric Acid, Serum: 3.8 mg/dL (ref 2.4–7.0)

## 2010-05-25 LAB — SEDIMENTATION RATE: Sed Rate: 16 mm/hr (ref 0–22)

## 2010-05-25 LAB — RHEUMATOID FACTOR: Rhuematoid fact SerPl-aCnc: <10 IU/mL (ref <=14)

## 2010-05-26 NOTE — Progress Notes (Signed)
Pt. Informed.

## 2010-07-08 ENCOUNTER — Telehealth: Payer: Self-pay | Admitting: Family Medicine

## 2010-07-08 MED ORDER — OMEPRAZOLE 40 MG PO CPDR: 40.0000 mg | DELAYED_RELEASE_CAPSULE | Freq: Every day | ORAL | Status: DC

## 2010-07-08 NOTE — Telephone Encounter (Signed)
Pt states that she can wait on Ritalin until MD gets back but needs omeprazole called in. Rx sent to pharmacy.

## 2010-07-08 NOTE — Telephone Encounter (Signed)
Refill Ritalin and Omeprazole.

## 2010-07-12 ENCOUNTER — Telehealth: Payer: Self-pay | Admitting: Family Medicine

## 2010-07-12 MED ORDER — METHYLPHENIDATE HCL ER (LA) 20 MG PO CP24: 20.0000 mg | ORAL_CAPSULE | Freq: Every day | ORAL | Status: DC

## 2010-07-12 MED ORDER — OMEPRAZOLE 40 MG PO CPDR: 40.0000 mg | DELAYED_RELEASE_CAPSULE | Freq: Every day | ORAL | Status: DC

## 2010-07-12 NOTE — Telephone Encounter (Signed)
Refills done.

## 2010-07-12 NOTE — Telephone Encounter (Signed)
Pt called last week to refill Ritalin. She knew that Dr Clent Ridges was out of town and would like for Dr Clent Ridges to return her call before 4 pm today.

## 2010-07-12 NOTE — Telephone Encounter (Signed)
lmoam to ask for Emily Avery.

## 2010-09-23 ENCOUNTER — Ambulatory Visit (INDEPENDENT_AMBULATORY_CARE_PROVIDER_SITE_OTHER): Payer: BC Managed Care – PPO | Admitting: Family Medicine

## 2010-09-23 ENCOUNTER — Encounter: Payer: Self-pay | Admitting: Family Medicine

## 2010-09-23 VITALS — BP 132/80 | HR 105 | Temp 98.7°F | Wt 191.0 lb

## 2010-09-23 DIAGNOSIS — J40 Bronchitis, not specified as acute or chronic: Secondary | ICD-10-CM

## 2010-09-23 MED ORDER — ZOLPIDEM TARTRATE 10 MG PO TABS: 10.0000 mg | ORAL_TABLET | Freq: Every evening | ORAL | Status: DC | PRN

## 2010-09-23 MED ORDER — FLUOXETINE HCL 40 MG PO CAPS: 40.0000 mg | ORAL_CAPSULE | Freq: Two times a day (BID) | ORAL | Status: DC

## 2010-09-23 MED ORDER — AZITHROMYCIN 250 MG PO TABS: ORAL_TABLET | ORAL | Status: AC

## 2010-09-23 MED ORDER — ALPRAZOLAM 2 MG PO TABS: 2.0000 mg | ORAL_TABLET | Freq: Every evening | ORAL | Status: DC | PRN

## 2010-09-23 MED ORDER — HYDROCOD POLST-CHLORPHEN POLST 10-8 MG/5ML PO LQCR: 5.0000 mL | Freq: Two times a day (BID) | ORAL | Status: DC

## 2010-09-23 NOTE — Progress Notes (Signed)
  Subjective:    Patient ID: Emily Avery, female    DOB: Jan 02, 1961, 50 y.o.   MRN: [id number]  HPI Here for 2 weeks of chest congestion and a dry cough. No fever.    Review of Systems  Constitutional: Negative.   HENT: Positive for congestion and postnasal drip.   Eyes: Negative.   Respiratory: Positive for cough and chest tightness.        Objective:   Physical Exam  Constitutional: She appears well-developed.  HENT:  Right Ear: External ear normal.  Left Ear: External ear normal.  Nose: Nose normal.  Mouth/Throat: Oropharynx is clear and moist. No oropharyngeal exudate.  Eyes: Conjunctivae are normal. Pupils are equal, round, and reactive to light.  Neck: No thyromegaly present.  Pulmonary/Chest: Effort normal and breath sounds normal. No respiratory distress. She has no wheezes. She has no rales. She exhibits no tenderness.  Lymphadenopathy:    She has no cervical adenopathy.          Assessment & Plan:  Rest, fluids

## 2010-10-06 ENCOUNTER — Telehealth: Payer: Self-pay | Admitting: Family Medicine

## 2010-10-06 NOTE — Telephone Encounter (Signed)
Was put on Prozac. Not working. Would like to Zoloft 200mg . Please call [facility]. Will be going out of town Saturday. Please advise. Thanks.

## 2010-10-07 NOTE — Telephone Encounter (Signed)
Okay, stop the prozac. Call in Zoloft 200 mg a day for one year

## 2010-10-12 MED ORDER — SERTRALINE HCL 100 MG PO TABS: 200.0000 mg | ORAL_TABLET | Freq: Every day | ORAL | Status: DC

## 2010-10-12 NOTE — Telephone Encounter (Signed)
Script called in and left voice message for pt. 

## 2010-11-07 ENCOUNTER — Telehealth: Payer: Self-pay | Admitting: Family Medicine

## 2010-11-07 NOTE — Telephone Encounter (Signed)
Changed medication directions

## 2010-11-23 ENCOUNTER — Other Ambulatory Visit: Payer: Self-pay | Admitting: Family Medicine

## 2010-11-23 NOTE — Telephone Encounter (Signed)
Pt called req refill of chlorpheniramine-HYDROcodone (TUSSIONEX) 10-8 MG/5ML LQCR for bronchitus/uri to [facility] [address]. Pt can not come in for ov because she does not have any time off from work.

## 2010-11-23 NOTE — Telephone Encounter (Signed)
Pt callback checking on status of med. Pt is aware waiting MD

## 2010-11-24 NOTE — Telephone Encounter (Signed)
Left voice message, advised pt to schedule a office visit.

## 2010-11-24 NOTE — Telephone Encounter (Signed)
NO we cannot call in a narcotic cough med without seeing her

## 2010-12-26 ENCOUNTER — Encounter: Payer: Self-pay | Admitting: Family Medicine

## 2010-12-26 ENCOUNTER — Ambulatory Visit (INDEPENDENT_AMBULATORY_CARE_PROVIDER_SITE_OTHER): Payer: BC Managed Care – PPO | Admitting: Family Medicine

## 2010-12-26 VITALS — BP 130/88 | HR 115 | Temp 98.5°F | Wt 187.0 lb

## 2010-12-26 DIAGNOSIS — M545 Low back pain, unspecified: Secondary | ICD-10-CM

## 2010-12-26 MED ORDER — VARENICLINE TARTRATE 0.5 MG X 11 & 1 MG X 42 PO MISC: ORAL | Status: DC

## 2010-12-26 MED ORDER — PREDNISONE (PAK) 10 MG PO TABS: ORAL_TABLET | ORAL | Status: DC

## 2010-12-26 MED ORDER — CYCLOBENZAPRINE HCL 10 MG PO TABS: 10.0000 mg | ORAL_TABLET | Freq: Three times a day (TID) | ORAL | Status: DC | PRN

## 2010-12-26 MED ORDER — VARENICLINE TARTRATE 1 MG PO TABS: 1.0000 mg | ORAL_TABLET | Freq: Two times a day (BID) | ORAL | Status: DC

## 2010-12-26 NOTE — Progress Notes (Signed)
  Subjective:    Patient ID: Emily Avery, female    DOB: Nov 04, 1960, 50 y.o.   MRN: [id number]  HPI Here for 4 days of sharp lower back pains. These do not radiate to the legs. No recent trauma. Using Flexeril, heat, and Diclofenac with mixed results.    Review of Systems  Constitutional: Negative.   Gastrointestinal: Negative.   Genitourinary: Negative.   Musculoskeletal: Positive for back pain.       Objective:   Physical Exam  Constitutional: She appears well-developed and well-nourished.  Abdominal: Soft. Bowel sounds are normal. There is no tenderness.  Musculoskeletal:       Tender in the lower back with spasm but full ROM          Assessment & Plan:  Recheck prn

## 2011-01-12 ENCOUNTER — Telehealth: Payer: Self-pay | Admitting: Family Medicine

## 2011-01-12 MED ORDER — SERTRALINE HCL 100 MG PO TABS: 200.0000 mg | ORAL_TABLET | Freq: Every day | ORAL | Status: DC

## 2011-01-12 NOTE — Telephone Encounter (Signed)
Rx sent to [facility] for 1 year.

## 2011-01-12 NOTE — Telephone Encounter (Signed)
Refill Zoloft to [facility]. Thanks.

## 2011-01-12 NOTE — Telephone Encounter (Signed)
Send in a one year supply  

## 2011-01-18 ENCOUNTER — Ambulatory Visit (INDEPENDENT_AMBULATORY_CARE_PROVIDER_SITE_OTHER): Payer: BC Managed Care – PPO | Admitting: Internal Medicine

## 2011-01-18 ENCOUNTER — Encounter: Payer: Self-pay | Admitting: Internal Medicine

## 2011-01-18 VITALS — BP 140/80 | Temp 100.6°F | Wt 197.0 lb

## 2011-01-18 DIAGNOSIS — J069 Acute upper respiratory infection, unspecified: Secondary | ICD-10-CM

## 2011-01-18 MED ORDER — CEFUROXIME AXETIL 500 MG PO TABS: 500.0000 mg | ORAL_TABLET | Freq: Two times a day (BID) | ORAL | Status: AC

## 2011-01-18 NOTE — Patient Instructions (Signed)
Drink plenty of fluids Use tylenol 650 mg every 8 hrs as needed Please call our office if your symptoms do not improve or gets worse.

## 2011-01-18 NOTE — Assessment & Plan Note (Signed)
50 year old white female with signs and symptoms of viral URI. She reports history of bronchitis. Prescription for cefuroxime 500 mg twice daily given to patient to have on hand,  We discussed symptoms of secondary sinusitis and appropriate use of antibiotics. Patient advised to call office if symptoms persist or worsen.

## 2011-01-18 NOTE — Progress Notes (Signed)
Subjective:    Patient ID: Emily Avery, female    DOB: 06-27-1960, 50 y.o.   MRN: [id number]  URI  This is a new problem. The current episode started in the past 7 days. The problem has been unchanged. The maximum temperature recorded prior to her arrival was 100 - 100.9 F. The fever has been present for 1 to 2 days. Associated symptoms include congestion and coughing. Pertinent negatives include no abdominal pain or vomiting.   She complains of prominent body aches.   Review of Systems  HENT: Positive for congestion.   Respiratory: Positive for cough.   Gastrointestinal: Negative for vomiting and abdominal pain.   Past Medical History  Diagnosis Date  . Depression   . Hypertension   . Osteoporosis   . GERD (gastroesophageal reflux disease)   . Overactive bladder     History   Social History  . Marital Status: Legally Separated    Spouse Name: N/A    Number of Children: N/A  . Years of Education: N/A   Occupational History  . Not on file.   Social History Main Topics  . Smoking status: Current Everyday Smoker -- 1.0 packs/day for 35 years    Types: Cigarettes  . Smokeless tobacco: Never Used  . Alcohol Use: Yes  . Drug Use: No  . Sexually Active: Not on file   Other Topics Concern  . Not on file   Social History Narrative  . No narrative on file    No past surgical history on file.  Family History  Problem Relation Age of Onset  . Alcohol abuse    . Arthritis    . Diabetes    . Hypertension    . Heart disease    . Colon cancer      Allergies  Allergen Reactions  . Codeine     Current Outpatient Prescriptions on File Prior to Visit  Medication Sig Dispense Refill  . alprazolam (XANAX) 2 MG tablet Take 2 mg by mouth at bedtime as needed.        . chlorpheniramine-HYDROcodone (TUSSIONEX) 10-8 MG/5ML LQCR Take 5 mLs by mouth every 12 (twelve) hours.  240 mL  0  . cyclobenzaprine (FLEXERIL) 10 MG tablet Take 1 tablet (10 mg total) by mouth 3  (three) times daily as needed for muscle spasms.  60 tablet  5  . FLUoxetine (PROZAC) 40 MG capsule Take 1 capsule (40 mg total) by mouth 2 (two) times daily.  60 capsule  2  . methylphenidate (RITALIN LA) 20 MG 24 hr capsule Take 1 capsule (20 mg total) by mouth daily.  30 capsule  0  . omeprazole (PRILOSEC) 40 MG capsule Take 1 capsule (40 mg total) by mouth daily.  30 capsule  11  . predniSONE (STERAPRED UNI-PAK) 10 MG tablet As directed for 12 days  48 tablet  0  . sertraline (ZOLOFT) 100 MG tablet Take 2 tablets (200 mg total) by mouth daily.  180 tablet  3    BP 140/80  Temp(Src) 100.6 F (38.1 C) (Oral)  Wt 197 lb (89.359 kg)     Objective:   Physical Exam  Constitutional: She appears well-developed and well-nourished. No distress.  HENT:  Head: Normocephalic and atraumatic.  Right Ear: External ear normal.  Left Ear: External ear normal.  Mouth/Throat: No oropharyngeal exudate.       Pharyngeal erythema.   Cardiovascular: Normal rate, regular rhythm and normal heart sounds.   Pulmonary/Chest: Effort normal and breath  sounds normal. No respiratory distress. She has no wheezes.  Skin: Skin is warm and dry.  Psychiatric: She has a normal mood and affect. Her behavior is normal.           Assessment & Plan:

## 2011-02-16 ENCOUNTER — Telehealth: Payer: Self-pay | Admitting: Family Medicine

## 2011-02-16 MED ORDER — METHYLPHENIDATE HCL ER (LA) 20 MG PO CP24: 20.0000 mg | ORAL_CAPSULE | Freq: Every day | ORAL | Status: DC

## 2011-02-16 NOTE — Telephone Encounter (Signed)
Scripts ready for pick up and I spoke with pt.  

## 2011-02-16 NOTE — Telephone Encounter (Signed)
Done

## 2011-02-16 NOTE — Telephone Encounter (Signed)
Pt called in to request refill on her Ritalin. Her mom will pick it up. Please call her cell when Rx is ready. Thanks

## 2011-05-08 ENCOUNTER — Encounter: Payer: Self-pay | Admitting: Family Medicine

## 2011-05-08 ENCOUNTER — Ambulatory Visit (INDEPENDENT_AMBULATORY_CARE_PROVIDER_SITE_OTHER): Payer: BC Managed Care – PPO | Admitting: Family Medicine

## 2011-05-08 VITALS — BP 138/84 | HR 110 | Temp 99.4°F | Wt 190.0 lb

## 2011-05-08 DIAGNOSIS — R739 Hyperglycemia, unspecified: Secondary | ICD-10-CM

## 2011-05-08 DIAGNOSIS — M549 Dorsalgia, unspecified: Secondary | ICD-10-CM

## 2011-05-08 DIAGNOSIS — E162 Hypoglycemia, unspecified: Secondary | ICD-10-CM

## 2011-05-08 DIAGNOSIS — R7309 Other abnormal glucose: Secondary | ICD-10-CM

## 2011-05-08 MED ORDER — DICLOFENAC SODIUM 75 MG PO TBEC: 75.0000 mg | DELAYED_RELEASE_TABLET | Freq: Two times a day (BID) | ORAL | Status: DC | PRN

## 2011-05-08 NOTE — Progress Notes (Signed)
  Subjective:    Patient ID: Emily Avery, female    DOB: 05/22/1960, 51 y.o.   MRN: [id number]  HPI Here to discuss drops in her blood sugar and her back pains. She has been off Diclofenac for awhile and trying to use Advil for her pain, but this is not helping much. Also over the past 3 weeks she has had several episodes of feeling weak, shaky, sweaty, and lightheaded. She used a friend's glucometer twice during these spells, and two readings were in the low 50s. Each time she felt better immediately after eating some candy. She has a family hx of diabetes.    Review of Systems  Constitutional: Negative.   Respiratory: Negative.   Cardiovascular: Negative.        Objective:   Physical Exam  Constitutional: She appears well-developed and well-nourished.  Cardiovascular: Normal rate, regular rhythm, normal heart sounds and intact distal pulses.   Pulmonary/Chest: Effort normal and breath sounds normal.          Assessment & Plan:  To avoid hypoglycemia, we discussed how she needs to eat frequent snacks throughout the day. Avoid candy or concentrated sweets. Check an A1c when she comes in for her cpx in a few weeks. Use Diclofenac prn

## 2011-06-05 ENCOUNTER — Other Ambulatory Visit (INDEPENDENT_AMBULATORY_CARE_PROVIDER_SITE_OTHER): Payer: BC Managed Care – PPO

## 2011-06-05 DIAGNOSIS — R739 Hyperglycemia, unspecified: Secondary | ICD-10-CM

## 2011-06-05 DIAGNOSIS — Z0000 Encounter for general adult medical examination without abnormal findings: Secondary | ICD-10-CM

## 2011-06-05 DIAGNOSIS — R7309 Other abnormal glucose: Secondary | ICD-10-CM

## 2011-06-05 LAB — CBC WITH DIFFERENTIAL/PLATELET
Basophils Absolute: 0.1 10*3/uL (ref 0.0–0.1)
Basophils Relative: 0.6 % (ref 0.0–3.0)
Eosinophils Absolute: 0.1 10*3/uL (ref 0.0–0.7)
Eosinophils Relative: 0.6 % (ref 0.0–5.0)
HCT: 46.7 % — ABNORMAL HIGH (ref 36.0–46.0)
Hemoglobin: 15.2 g/dL — ABNORMAL HIGH (ref 12.0–15.0)
Lymphocytes Relative: 27.4 % (ref 12.0–46.0)
Lymphs Abs: 3.2 10*3/uL (ref 0.7–4.0)
MCHC: 32.6 g/dL (ref 30.0–36.0)
MCV: 92.2 fl (ref 78.0–100.0)
Monocytes Absolute: 0.6 10*3/uL (ref 0.1–1.0)
Monocytes Relative: 5.5 % (ref 3.0–12.0)
Neutro Abs: 7.7 10*3/uL (ref 1.4–7.7)
Neutrophils Relative %: 65.9 % (ref 43.0–77.0)
Platelets: 349 10*3/uL (ref 150.0–400.0)
RBC: 5.06 Mil/uL (ref 3.87–5.11)
RDW: 13 % (ref 11.5–14.6)
WBC: 11.7 10*3/uL — ABNORMAL HIGH (ref 4.5–10.5)

## 2011-06-05 LAB — LIPID PANEL
Cholesterol: 197 mg/dL (ref 0–200)
HDL: 33.2 mg/dL — ABNORMAL LOW (ref >39.00)
Total CHOL/HDL Ratio: 6
Triglycerides: 209 mg/dL — ABNORMAL HIGH (ref 0.0–149.0)
VLDL: 41.8 mg/dL — ABNORMAL HIGH (ref 0.0–40.0)

## 2011-06-05 LAB — BASIC METABOLIC PANEL
BUN: 14 mg/dL (ref 6–23)
CO2: 23 mEq/L (ref 19–32)
Calcium: 8.8 mg/dL (ref 8.4–10.5)
Chloride: 100 mEq/L (ref 96–112)
Creatinine, Ser: 0.7 mg/dL (ref 0.4–1.2)
GFR: 90.06 mL/min (ref >60.00)
Glucose, Bld: 85 mg/dL (ref 70–99)
Potassium: 3.9 mEq/L (ref 3.5–5.1)
Sodium: 138 mEq/L (ref 135–145)

## 2011-06-05 LAB — TSH: TSH: 1.76 u[IU]/mL (ref 0.35–5.50)

## 2011-06-05 LAB — HEPATIC FUNCTION PANEL
ALT: 13 U/L (ref 0–35)
AST: 16 U/L (ref 0–37)
Albumin: 3.9 g/dL (ref 3.5–5.2)
Alkaline Phosphatase: 93 U/L (ref 39–117)
Bilirubin, Direct: 0 mg/dL (ref 0.0–0.3)
Total Bilirubin: 0.5 mg/dL (ref 0.3–1.2)
Total Protein: 7.7 g/dL (ref 6.0–8.3)

## 2011-06-05 LAB — POCT URINALYSIS DIPSTICK
Blood, UA: NEGATIVE
Glucose, UA: NEGATIVE
Ketones, UA: NEGATIVE
Leukocytes, UA: NEGATIVE
Nitrite, UA: NEGATIVE
Spec Grav, UA: >=1.03
Urobilinogen, UA: 0.2
pH, UA: 5.5

## 2011-06-05 LAB — HEMOGLOBIN A1C: Hgb A1c MFr Bld: 5.9 % (ref 4.6–6.5)

## 2011-06-05 LAB — LDL CHOLESTEROL, DIRECT: Direct LDL: 144.7 mg/dL

## 2011-06-07 ENCOUNTER — Encounter: Payer: Self-pay | Admitting: Family Medicine

## 2011-06-07 NOTE — Progress Notes (Signed)
Quick Note:  I tried to reach pt by phone, no answer. I put a copy of results in mail. ______ 

## 2011-06-14 ENCOUNTER — Encounter: Payer: Self-pay | Admitting: Family Medicine

## 2011-06-14 ENCOUNTER — Ambulatory Visit (INDEPENDENT_AMBULATORY_CARE_PROVIDER_SITE_OTHER): Payer: BC Managed Care – PPO | Admitting: Family Medicine

## 2011-06-14 VITALS — BP 156/96 | HR 102 | Temp 98.9°F | Ht 62.75 in | Wt 192.0 lb

## 2011-06-14 DIAGNOSIS — Z0000 Encounter for general adult medical examination without abnormal findings: Secondary | ICD-10-CM

## 2011-06-14 MED ORDER — ZOLPIDEM TARTRATE 10 MG PO TABS: 10.0000 mg | ORAL_TABLET | Freq: Every evening | ORAL | Status: DC | PRN

## 2011-06-14 MED ORDER — LISINOPRIL 10 MG PO TABS: 10.0000 mg | ORAL_TABLET | Freq: Every day | ORAL | Status: DC

## 2011-06-14 MED ORDER — ALPRAZOLAM 2 MG PO TABS: 2.0000 mg | ORAL_TABLET | Freq: Every evening | ORAL | Status: DC | PRN

## 2011-06-14 MED ORDER — DICLOFENAC SODIUM 75 MG PO TBEC: 75.0000 mg | DELAYED_RELEASE_TABLET | Freq: Two times a day (BID) | ORAL | Status: AC | PRN

## 2011-06-14 MED ORDER — ATORVASTATIN CALCIUM 20 MG PO TABS: 20.0000 mg | ORAL_TABLET | Freq: Every day | ORAL | Status: DC

## 2011-06-14 MED ORDER — CYCLOBENZAPRINE HCL 10 MG PO TABS: 10.0000 mg | ORAL_TABLET | Freq: Three times a day (TID) | ORAL | Status: DC | PRN

## 2011-06-14 MED ORDER — SERTRALINE HCL 100 MG PO TABS: 200.0000 mg | ORAL_TABLET | Freq: Every day | ORAL | Status: DC

## 2011-06-14 NOTE — Progress Notes (Signed)
  Subjective:    Patient ID: Emily Avery, female    DOB: 05-04-1960, 51 y.o.   MRN: [id number]  HPI 51 yr old female for a cpx. She feels fine but admits to eating poorly and not exercising at all.    Review of Systems  Constitutional: Negative.   HENT: Negative.   Eyes: Negative.   Respiratory: Negative.   Cardiovascular: Negative.   Gastrointestinal: Negative.   Genitourinary: Negative for dysuria, urgency, frequency, hematuria, flank pain, decreased urine volume, enuresis, difficulty urinating, pelvic pain and dyspareunia.  Musculoskeletal: Negative.   Skin: Negative.   Neurological: Negative.   Hematological: Negative.   Psychiatric/Behavioral: Negative.        Objective:   Physical Exam  Constitutional: She is oriented to person, place, and time. She appears well-developed and well-nourished. No distress.  HENT:  Head: Normocephalic and atraumatic.  Right Ear: External ear normal.  Left Ear: External ear normal.  Nose: Nose normal.  Mouth/Throat: Oropharynx is clear and moist. No oropharyngeal exudate.  Eyes: Conjunctivae and EOM are normal. Pupils are equal, round, and reactive to light. No scleral icterus.  Neck: Normal range of motion. Neck supple. No JVD present. No thyromegaly present.  Cardiovascular: Normal rate, regular rhythm, normal heart sounds and intact distal pulses.  Exam reveals no gallop and no friction rub.   No murmur heard. Pulmonary/Chest: Effort normal and breath sounds normal. No respiratory distress. She has no wheezes. She has no rales. She exhibits no tenderness.  Abdominal: Soft. Bowel sounds are normal. She exhibits no distension and no mass. There is no tenderness. There is no rebound and no guarding.  Musculoskeletal: Normal range of motion. She exhibits no edema and no tenderness.  Lymphadenopathy:    She has no cervical adenopathy.  Neurological: She is alert and oriented to person, place, and time. She has normal reflexes. No  cranial nerve deficit. She exhibits normal muscle tone. Coordination normal.  Skin: Skin is warm and dry. No rash noted. No erythema.  Psychiatric: She has a normal mood and affect. Her behavior is normal. Judgment and thought content normal.          Assessment & Plan:  Well exam. Start on Lipitor and Lisinopril. We discussed diet and exercise. Recheck in one month

## 2011-10-18 ENCOUNTER — Ambulatory Visit (INDEPENDENT_AMBULATORY_CARE_PROVIDER_SITE_OTHER): Payer: Self-pay | Admitting: Family Medicine

## 2011-10-18 ENCOUNTER — Encounter: Payer: Self-pay | Admitting: Family Medicine

## 2011-10-18 VITALS — BP 132/90 | HR 90 | Temp 98.4°F | Wt 190.0 lb

## 2011-10-18 DIAGNOSIS — N390 Urinary tract infection, site not specified: Secondary | ICD-10-CM

## 2011-10-18 DIAGNOSIS — F329 Major depressive disorder, single episode, unspecified: Secondary | ICD-10-CM

## 2011-10-18 LAB — POCT URINALYSIS DIPSTICK
Bilirubin, UA: NEGATIVE
Glucose, UA: NEGATIVE
Ketones, UA: NEGATIVE
Leukocytes, UA: NEGATIVE
Nitrite, UA: NEGATIVE
Protein, UA: NEGATIVE
Spec Grav, UA: 1.025
Urobilinogen, UA: 0.2
pH, UA: 6

## 2011-10-18 MED ORDER — LAMOTRIGINE 25 MG PO TABS: 25.0000 mg | ORAL_TABLET | Freq: Every day | ORAL | Status: DC

## 2011-10-18 MED ORDER — CIPROFLOXACIN HCL 500 MG PO TABS: 500.0000 mg | ORAL_TABLET | Freq: Two times a day (BID) | ORAL | Status: DC

## 2011-10-18 NOTE — Addendum Note (Signed)
Addended by: Aniceto Boss A on: 10/18/2011 12:17 PM   Modules accepted: Orders

## 2011-10-18 NOTE — Progress Notes (Signed)
  Subjective:    Patient ID: Emily Avery, female    DOB: 1960-09-11, 51 y.o.   MRN: [id number]  HPI Here for 2 reasons. First for 2 days she has had burning on urination and urgency. No fever or nausea. Drinking plenty of water. Also she has been having more mood swings lately with crying spells and irritability. She asks if there is something we could add to her Zoloft to help with this.    Review of Systems  Constitutional: Negative.   Gastrointestinal: Negative.   Genitourinary: Positive for dysuria, urgency and frequency. Negative for hematuria, flank pain and pelvic pain.  Psychiatric/Behavioral: Positive for dysphoric mood and agitation. Negative for confusion and decreased concentration. The patient is nervous/anxious.        Objective:   Physical Exam  Constitutional: She appears well-developed and well-nourished.  Abdominal: Soft. Bowel sounds are normal. She exhibits no distension and no mass. There is no tenderness. There is no rebound and no guarding.  Psychiatric: She has a normal mood and affect. Her behavior is normal. Thought content normal.          Assessment & Plan:  Treat the UTI with Cipro. Culture the urine. Try Lamictal 25 mg a day for 2 weeks. If this is not helping she may increase this to 2 pills a day (a total of 50 mg).

## 2011-10-20 LAB — URINE CULTURE: Colony Count: 30000

## 2011-10-23 NOTE — Progress Notes (Signed)
Quick Note:  I spoke with pt ______ 

## 2011-11-27 ENCOUNTER — Encounter: Payer: Self-pay | Admitting: Family Medicine

## 2011-11-27 ENCOUNTER — Ambulatory Visit (INDEPENDENT_AMBULATORY_CARE_PROVIDER_SITE_OTHER): Payer: Self-pay | Admitting: Family Medicine

## 2011-11-27 VITALS — BP 122/84 | HR 90 | Temp 98.4°F | Wt 195.0 lb

## 2011-11-27 DIAGNOSIS — B029 Zoster without complications: Secondary | ICD-10-CM

## 2011-11-27 MED ORDER — VALACYCLOVIR HCL 1 G PO TABS: 1000.0000 mg | ORAL_TABLET | Freq: Three times a day (TID) | ORAL | Status: DC

## 2011-11-27 MED ORDER — METHYLPREDNISOLONE 4 MG PO KIT: PACK | ORAL | Status: AC

## 2011-11-27 NOTE — Progress Notes (Signed)
  Subjective:    Patient ID: Emily Avery, female    DOB: 1960-08-05, 51 y.o.   MRN: [id number]  HPI Here for 4 days of a burning pain around the left chest and upper back. Then yesterday a rash appeared in this area.    Review of Systems  Constitutional: Negative.   Respiratory: Negative.   Cardiovascular: Positive for chest pain. Negative for palpitations and leg swelling.  Skin: Positive for rash.       Objective:   Physical Exam  Constitutional: She appears well-developed and well-nourished.  Skin:       Several erythematous macules and vesicles around the left upper back and left chest           Assessment & Plan:  Recheck prn

## 2012-02-16 ENCOUNTER — Other Ambulatory Visit: Payer: Self-pay | Admitting: Family Medicine

## 2012-02-16 NOTE — Telephone Encounter (Signed)
Call in #[phone] with one rf for both

## 2012-03-21 ENCOUNTER — Telehealth: Payer: Self-pay | Admitting: Family Medicine

## 2012-03-21 MED ORDER — METHYLPHENIDATE HCL ER (LA) 20 MG PO CP24: 20.0000 mg | ORAL_CAPSULE | ORAL | Status: DC

## 2012-03-21 MED ORDER — ESOMEPRAZOLE MAGNESIUM 40 MG PO CPDR: 40.0000 mg | DELAYED_RELEASE_CAPSULE | Freq: Every day | ORAL | Status: DC

## 2012-03-21 NOTE — Telephone Encounter (Signed)
done

## 2012-03-21 NOTE — Telephone Encounter (Signed)
Patient is requesting a refill of Ritalin and Nexium .  She is out of both medications. Not noted on Medications list.  Last time had Ritalin was in March 2013 and unsure of the Nexium. Advised caller she made need to make an appt for medication and evaulation.  Last exam per patient was May 2013.   Patient states she has been out of work since last May and has not needed it and is getting ready to start a new job on March 10th.   Please advise.  Contact patient [phone].   Pharmacy [facility].

## 2012-03-21 NOTE — Telephone Encounter (Signed)
Opened in error

## 2012-03-21 NOTE — Telephone Encounter (Signed)
Scripts are ready for pick up and I left voice message for pt.

## 2012-04-22 ENCOUNTER — Emergency Department (INDEPENDENT_AMBULATORY_CARE_PROVIDER_SITE_OTHER)
Admission: EM | Admit: 2012-04-22 | Discharge: 2012-04-22 | Disposition: A | Discharge status: Home / Self Care | Payer: Self-pay | Source: Home / Self Care | Attending: Family Medicine | Admitting: Family Medicine

## 2012-04-22 ENCOUNTER — Encounter (HOSPITAL_COMMUNITY): Payer: Self-pay | Admitting: *Deleted

## 2012-04-22 DIAGNOSIS — J411 Mucopurulent chronic bronchitis: Secondary | ICD-10-CM

## 2012-04-22 MED ORDER — DM-GUAIFENESIN ER 30-600 MG PO TB12: 2.0000 | ORAL_TABLET | Freq: Two times a day (BID) | ORAL | Status: DC

## 2012-04-22 MED ORDER — LEVOFLOXACIN 500 MG PO TABS: 500.0000 mg | ORAL_TABLET | Freq: Every day | ORAL | Status: DC

## 2012-04-22 NOTE — ED Notes (Signed)
Pt  Is  A  Smoker  She  Has    A   Non  Productive  Cough   With  A  Sensation  Of  Tightness in her  Chest      of  Tightness  In her  Chest     She  Reports  The  Symptoms  Are  Not  releived  By  otc  meds       She  Reports  The  Symptoms  Have  Been  persistant  And  Have  Lasted   Almost  3  Week

## 2012-04-22 NOTE — ED Provider Notes (Signed)
History     CSN: [id number]  Arrival date & time 04/22/12  4098   First MD Initiated Contact with Patient 04/22/12 1909      Chief Complaint  Patient presents with  . Cough    (Consider location/radiation/quality/duration/timing/severity/associated sxs/prior treatment) Patient is a 52 y.o. female presenting with cough. The history is provided by the patient.  Cough Cough characteristics:  Productive Sputum characteristics:  Green Severity:  Moderate Onset quality:  Gradual Duration:  3 weeks Timing:  Constant Chronicity:  Recurrent Smoker: yes   Associated symptoms: rhinorrhea   Associated symptoms: no shortness of breath     Past Medical History  Diagnosis Date  . Depression   . Hypertension   . Osteoporosis   . GERD (gastroesophageal reflux disease)   . Overactive bladder   . Kidney stones   . Diverticulitis of colon     History reviewed. No pertinent past surgical history.  Family History  Problem Relation Age of Onset  . Alcohol abuse    . Arthritis    . Diabetes    . Hypertension    . Heart disease    . Colon cancer      History  Substance Use Topics  . Smoking status: Current Every Day Smoker -- 1.00 packs/day for 35 years    Types: Cigarettes  . Smokeless tobacco: Never Used  . Alcohol Use: Yes     Comment: once every 3 months    OB History   Grav Para Term Preterm Abortions TAB SAB Ect Mult Living                  Review of Systems  Constitutional: Negative.   HENT: Positive for congestion and rhinorrhea.   Respiratory: Positive for cough. Negative for shortness of breath.   Cardiovascular: Negative.   Gastrointestinal: Negative.     Allergies  Codeine  Home Medications   Current Outpatient Rx  Name  Route  Sig  Dispense  Refill  . alprazolam (XANAX) 2 MG tablet      TAKE ONE TABLET BY MOUTH AT BEDTIME AS NEEDED FOR SLEEP   90 tablet   5   . atorvastatin (LIPITOR) 20 MG tablet   Oral   Take 1 tablet (20 mg total) by  mouth daily.   90 tablet   3   . cyclobenzaprine (FLEXERIL) 10 MG tablet   Oral   Take 1 tablet (10 mg total) by mouth 3 (three) times daily as needed for muscle spasms.   90 tablet   3   . dextromethorphan-guaiFENesin (MUCINEX DM) 30-600 MG per 12 hr tablet   Oral   Take 2 tablets by mouth every 12 (twelve) hours.   30 tablet   1   . diclofenac (VOLTAREN) 75 MG EC tablet   Oral   Take 1 tablet (75 mg total) by mouth 2 (two) times daily as needed (pain).   180 tablet   3   . esomeprazole (NEXIUM) 40 MG capsule   Oral   Take 1 capsule (40 mg total) by mouth daily.   30 capsule   11   . lamoTRIgine (LAMICTAL) 25 MG tablet   Oral   Take 1 tablet (25 mg total) by mouth daily.   30 tablet   5   . levofloxacin (LEVAQUIN) 500 MG tablet   Oral   Take 1 tablet (500 mg total) by mouth daily.   10 tablet   0   . lisinopril (PRINIVIL,ZESTRIL) 10 MG  tablet   Oral   Take 1 tablet (10 mg total) by mouth daily.   90 tablet   3   . methylphenidate (RITALIN LA) 20 MG 24 hr capsule   Oral   Take 1 capsule (20 mg total) by mouth every morning.   30 capsule   0     May fill on 05-19-12   . sertraline (ZOLOFT) 100 MG tablet   Oral   Take 2 tablets (200 mg total) by mouth daily.   180 tablet   3   . valACYclovir (VALTREX) 1000 MG tablet   Oral   Take 1 tablet (1,000 mg total) by mouth 3 (three) times daily.   30 tablet   0   . EXPIRED: zolpidem (AMBIEN) 10 MG tablet   Oral   Take 1 tablet (10 mg total) by mouth at bedtime as needed for sleep.   90 tablet   1   . zolpidem (AMBIEN) 10 MG tablet      TAKE ONE TABLET BY MOUTH EVERY DAY AT BEDTIME AS NEEDED FOR SLEEP   90 tablet   5     LMP 04/22/2012  Physical Exam  Nursing note and vitals reviewed. Constitutional: She is oriented to person, place, and time. She appears well-developed and well-nourished.  HENT:  Head: Normocephalic.  Right Ear: External ear normal.  Left Ear: External ear normal.   Mouth/Throat: Oropharynx is clear and moist.  Eyes: Pupils are equal, round, and reactive to light.  Neck: Normal range of motion. Neck supple.  Cardiovascular: Normal rate, normal heart sounds and intact distal pulses.   Pulmonary/Chest: She has decreased breath sounds. She has wheezes. She has rhonchi.  Abdominal: Soft. Bowel sounds are normal. There is no tenderness.  Lymphadenopathy:    She has no cervical adenopathy.  Neurological: She is alert and oriented to person, place, and time.  Skin: Skin is warm and dry.    ED Course  Procedures (including critical care time)  Labs Reviewed - No data to display No results found.   1. Chronic bronchitis with productive mucopurulent cough       MDM          Linna Hoff, MD 04/22/12 2007

## 2012-05-29 ENCOUNTER — Ambulatory Visit (INDEPENDENT_AMBULATORY_CARE_PROVIDER_SITE_OTHER): Payer: Self-pay | Admitting: Family Medicine

## 2012-05-29 ENCOUNTER — Encounter: Payer: Self-pay | Admitting: Family Medicine

## 2012-05-29 VITALS — BP 124/70 | HR 111 | Temp 98.4°F | Wt 191.0 lb

## 2012-05-29 DIAGNOSIS — K589 Irritable bowel syndrome without diarrhea: Secondary | ICD-10-CM | POA: Insufficient documentation

## 2012-05-29 DIAGNOSIS — L989 Disorder of the skin and subcutaneous tissue, unspecified: Secondary | ICD-10-CM

## 2012-05-29 DIAGNOSIS — F17200 Nicotine dependence, unspecified, uncomplicated: Secondary | ICD-10-CM

## 2012-05-29 MED ORDER — VARENICLINE TARTRATE 1 MG PO TABS: 1.0000 mg | ORAL_TABLET | Freq: Two times a day (BID) | ORAL | Status: DC

## 2012-05-29 MED ORDER — DICYCLOMINE HCL 20 MG PO TABS: 20.0000 mg | ORAL_TABLET | Freq: Three times a day (TID) | ORAL | Status: DC | PRN

## 2012-05-29 MED ORDER — CELECOXIB 200 MG PO CAPS: 200.0000 mg | ORAL_CAPSULE | Freq: Two times a day (BID) | ORAL | Status: DC

## 2012-05-29 MED ORDER — VARENICLINE TARTRATE 0.5 MG X 11 & 1 MG X 42 PO MISC: ORAL | Status: DC

## 2012-05-29 NOTE — Progress Notes (Signed)
  Subjective:    Patient ID: Emily Avery, female    DOB: 06/01/1960, 52 y.o.   MRN: [id number]  HPI Here for several issues. First her IBS has gotten to the point that she has loose stools and stool urgency every day. Also she wants to quit smoking. Also she has several suspicious lesions that came up on her skin in the past 6 months.    Review of Systems  Constitutional: Negative.   Gastrointestinal: Positive for diarrhea. Negative for nausea, vomiting, abdominal pain, constipation, blood in stool and abdominal distention.       Objective:   Physical Exam  Constitutional: She appears well-developed and well-nourished.  Abdominal: Soft. Bowel sounds are normal. She exhibits no distension and no mass. There is no tenderness. There is no rebound and no guarding.  Skin:  She has one dark nevus on each cheek and a patch of scaly red skin on the right upper arm          Assessment & Plan:  Try Bentyl tid prn. Refer to Dermatology. Try Chantix.

## 2012-05-30 ENCOUNTER — Telehealth: Payer: Self-pay | Admitting: Family Medicine

## 2012-05-30 MED ORDER — MELOXICAM 15 MG PO TABS: 15.0000 mg | ORAL_TABLET | Freq: Every day | ORAL | Status: DC

## 2012-05-30 NOTE — Telephone Encounter (Signed)
Pt called and explained Dr. Claris Che recommendations.  Rx for meloxicam sent to pharmacy.  Pt states dicyclomine (BENTYL) 20 MG tablet was not covered by her insurance and she needs another medication called in as well.

## 2012-05-30 NOTE — Telephone Encounter (Signed)
Insurance does not want to pay for Celebrex & OOP will cost pt $1200.00. Prior auth possible, if she's tried 2 other rx strength NSAIDS, which I did not see. Is there any alternatvie to Celebrex she can try first, or has tried? Mobic, etc? Thanks!

## 2012-05-30 NOTE — Telephone Encounter (Signed)
Try Meloxicam 15 mg daily. Give her a 6 month supply

## 2012-05-31 NOTE — Telephone Encounter (Signed)
I have NEVER had this not covered by insurance. She needs to contact the company and ask what "GI antispasmotic" meds they will cover

## 2012-05-31 NOTE — Telephone Encounter (Signed)
Left message on machine returning patient's call 

## 2012-06-03 NOTE — Telephone Encounter (Signed)
I left voice message with below information. 

## 2012-07-02 ENCOUNTER — Ambulatory Visit (INDEPENDENT_AMBULATORY_CARE_PROVIDER_SITE_OTHER): Payer: BC Managed Care – PPO | Admitting: Family Medicine

## 2012-07-02 ENCOUNTER — Encounter: Payer: Self-pay | Admitting: Family Medicine

## 2012-07-02 VITALS — BP 122/60 | HR 106 | Temp 98.8°F | Wt 192.0 lb

## 2012-07-02 DIAGNOSIS — K589 Irritable bowel syndrome without diarrhea: Secondary | ICD-10-CM

## 2012-07-02 MED ORDER — TRAZODONE HCL 50 MG PO TABS: 50.0000 mg | ORAL_TABLET | Freq: Every day | ORAL | Status: DC

## 2012-07-02 NOTE — Progress Notes (Signed)
  Subjective:    Patient ID: Emily Avery, female    DOB: [birth year], 52 y.o.   MRN: [id number]  HPI Here to follow up on IBS. She has been having fecal urgency and nothing but Imodium seems to help. She tried Bentyl with no effect.    Review of Systems  Constitutional: Negative.   Gastrointestinal: Positive for diarrhea. Negative for nausea, abdominal pain, constipation, blood in stool, abdominal distention and rectal pain.       Objective:   Physical Exam  Constitutional: She appears well-developed and well-nourished.  Abdominal: Soft. Bowel sounds are normal. She exhibits no distension and no mass. There is no tenderness. There is no rebound and no guarding.          Assessment & Plan:  Try Trazadone 50 mg qhs for one week, then increase to 100 mg qhs.

## 2012-07-26 ENCOUNTER — Other Ambulatory Visit: Payer: Self-pay | Admitting: Family Medicine

## 2012-08-16 ENCOUNTER — Other Ambulatory Visit: Payer: Self-pay | Admitting: Family Medicine

## 2012-08-17 ENCOUNTER — Other Ambulatory Visit: Payer: Self-pay | Admitting: Family Medicine

## 2012-08-19 NOTE — Telephone Encounter (Signed)
Okay for 6 months of each

## 2012-10-01 ENCOUNTER — Other Ambulatory Visit: Payer: Self-pay | Admitting: Family Medicine

## 2012-10-02 NOTE — Telephone Encounter (Signed)
Refill Lisinopril for one year. Refill Lipitor for 90 days only. She needs fasting labs soon.

## 2012-10-02 NOTE — Telephone Encounter (Signed)
Can we refill these? 

## 2012-10-03 ENCOUNTER — Telehealth: Payer: Self-pay | Admitting: Family Medicine

## 2012-10-03 MED ORDER — METHYLPHENIDATE HCL ER (LA) 20 MG PO CP24: 20.0000 mg | ORAL_CAPSULE | ORAL | Status: DC

## 2012-10-03 NOTE — Telephone Encounter (Signed)
PT is calling to request a 3 month supply of her methylphenidate (RITALIN LA) 20 MG 24 hr capsule. Please assist.

## 2012-10-03 NOTE — Telephone Encounter (Signed)
done

## 2012-10-03 NOTE — Telephone Encounter (Signed)
Rx ready for pick up and Left message on machine for patient   

## 2012-11-24 ENCOUNTER — Other Ambulatory Visit: Payer: Self-pay | Admitting: Family Medicine

## 2012-11-25 NOTE — Telephone Encounter (Signed)
Call in #90 with one rf 

## 2012-11-26 ENCOUNTER — Other Ambulatory Visit: Payer: Self-pay | Admitting: Family Medicine

## 2012-11-27 NOTE — Telephone Encounter (Signed)
Call in #90 with 5 rf 

## 2013-01-17 ENCOUNTER — Other Ambulatory Visit: Payer: Self-pay | Admitting: Family Medicine

## 2013-02-14 ENCOUNTER — Telehealth: Payer: Self-pay | Admitting: Family Medicine

## 2013-02-14 MED ORDER — METHYLPHENIDATE HCL ER (LA) 20 MG PO CP24: 20.0000 mg | ORAL_CAPSULE | ORAL | Status: DC

## 2013-02-14 NOTE — Telephone Encounter (Signed)
Done for one month only  

## 2013-02-14 NOTE — Telephone Encounter (Signed)
Script is ready for pick up and I spoke with pt.  

## 2013-02-14 NOTE — Telephone Encounter (Signed)
Pt needs new rx generic ritalin 20 mg. Pt has made an appt

## 2013-02-18 ENCOUNTER — Encounter: Payer: Self-pay | Admitting: Family Medicine

## 2013-02-18 ENCOUNTER — Ambulatory Visit (INDEPENDENT_AMBULATORY_CARE_PROVIDER_SITE_OTHER): Payer: Self-pay | Admitting: Family Medicine

## 2013-02-18 VITALS — BP 140/90 | HR 94 | Temp 98.5°F | Ht 62.75 in | Wt 200.0 lb

## 2013-02-18 DIAGNOSIS — F909 Attention-deficit hyperactivity disorder, unspecified type: Secondary | ICD-10-CM

## 2013-02-18 DIAGNOSIS — K219 Gastro-esophageal reflux disease without esophagitis: Secondary | ICD-10-CM

## 2013-02-18 DIAGNOSIS — I10 Essential (primary) hypertension: Secondary | ICD-10-CM

## 2013-02-18 DIAGNOSIS — F329 Major depressive disorder, single episode, unspecified: Secondary | ICD-10-CM

## 2013-02-18 DIAGNOSIS — J40 Bronchitis, not specified as acute or chronic: Secondary | ICD-10-CM

## 2013-02-18 DIAGNOSIS — E785 Hyperlipidemia, unspecified: Secondary | ICD-10-CM

## 2013-02-18 DIAGNOSIS — E782 Mixed hyperlipidemia: Secondary | ICD-10-CM | POA: Insufficient documentation

## 2013-02-18 DIAGNOSIS — F3289 Other specified depressive episodes: Secondary | ICD-10-CM

## 2013-02-18 LAB — CBC WITH DIFFERENTIAL/PLATELET
Basophils Absolute: 0 10*3/uL (ref 0.0–0.1)
Basophils Relative: 0.4 % (ref 0.0–3.0)
Eosinophils Absolute: 0.2 10*3/uL (ref 0.0–0.7)
Eosinophils Relative: 1.8 % (ref 0.0–5.0)
HCT: 45.7 % (ref 36.0–46.0)
Hemoglobin: 15.3 g/dL — ABNORMAL HIGH (ref 12.0–15.0)
Lymphocytes Relative: 45.1 % (ref 12.0–46.0)
Lymphs Abs: 4.7 10*3/uL — ABNORMAL HIGH (ref 0.7–4.0)
MCHC: 33.6 g/dL (ref 30.0–36.0)
MCV: 92.5 fl (ref 78.0–100.0)
Monocytes Absolute: 0.6 10*3/uL (ref 0.1–1.0)
Monocytes Relative: 5.9 % (ref 3.0–12.0)
Neutro Abs: 4.9 10*3/uL (ref 1.4–7.7)
Neutrophils Relative %: 46.8 % (ref 43.0–77.0)
Platelets: 326 10*3/uL (ref 150.0–400.0)
RBC: 4.94 Mil/uL (ref 3.87–5.11)
RDW: 13.3 % (ref 11.5–14.6)
WBC: 10.4 10*3/uL (ref 4.5–10.5)

## 2013-02-18 LAB — POCT URINALYSIS DIPSTICK
Bilirubin, UA: NEGATIVE
Blood, UA: NEGATIVE
Glucose, UA: NEGATIVE
Ketones, UA: NEGATIVE
Leukocytes, UA: NEGATIVE
Nitrite, UA: NEGATIVE
Protein, UA: NEGATIVE
Spec Grav, UA: >=1.03
Urobilinogen, UA: 0.2
pH, UA: 5

## 2013-02-18 LAB — LIPID PANEL
Cholesterol: 140 mg/dL (ref 0–200)
HDL: 33.7 mg/dL — ABNORMAL LOW (ref >39.00)
LDL Cholesterol: 85 mg/dL (ref 0–99)
Total CHOL/HDL Ratio: 4
Triglycerides: 109 mg/dL (ref 0.0–149.0)
VLDL: 21.8 mg/dL (ref 0.0–40.0)

## 2013-02-18 LAB — HEPATIC FUNCTION PANEL
ALT: 20 U/L (ref 0–35)
AST: 19 U/L (ref 0–37)
Albumin: 4.2 g/dL (ref 3.5–5.2)
Alkaline Phosphatase: 119 U/L — ABNORMAL HIGH (ref 39–117)
Bilirubin, Direct: 0 mg/dL (ref 0.0–0.3)
Total Bilirubin: 0.5 mg/dL (ref 0.3–1.2)
Total Protein: 7.6 g/dL (ref 6.0–8.3)

## 2013-02-18 LAB — BASIC METABOLIC PANEL
BUN: 18 mg/dL (ref 6–23)
CO2: 26 mEq/L (ref 19–32)
Calcium: 9.4 mg/dL (ref 8.4–10.5)
Chloride: 103 mEq/L (ref 96–112)
Creatinine, Ser: 0.6 mg/dL (ref 0.4–1.2)
GFR: 112.15 mL/min (ref >60.00)
Glucose, Bld: 81 mg/dL (ref 70–99)
Potassium: 4.3 mEq/L (ref 3.5–5.1)
Sodium: 139 mEq/L (ref 135–145)

## 2013-02-18 LAB — TSH: TSH: 1.24 u[IU]/mL (ref 0.35–5.50)

## 2013-02-18 MED ORDER — TRAZODONE HCL 100 MG PO TABS: 100.0000 mg | ORAL_TABLET | Freq: Every day | ORAL | Status: DC

## 2013-02-18 MED ORDER — AZITHROMYCIN 250 MG PO TABS: ORAL_TABLET | ORAL | Status: DC

## 2013-02-18 MED ORDER — SERTRALINE HCL 100 MG PO TABS
100.0000 mg | ORAL_TABLET | Freq: Two times a day (BID) | ORAL | Status: DC
Start: 2013-02-18 — End: 2014-02-23

## 2013-02-18 MED ORDER — ATORVASTATIN CALCIUM 20 MG PO TABS: ORAL_TABLET | ORAL | Status: DC

## 2013-02-18 MED ORDER — HYDROCOD POLST-CHLORPHEN POLST 10-8 MG/5ML PO LQCR: 5.0000 mL | Freq: Two times a day (BID) | ORAL | Status: DC | PRN

## 2013-02-18 MED ORDER — MELOXICAM 15 MG PO TABS: 15.0000 mg | ORAL_TABLET | Freq: Every day | ORAL | Status: DC

## 2013-02-18 MED ORDER — LISINOPRIL 10 MG PO TABS: 10.0000 mg | ORAL_TABLET | Freq: Every day | ORAL | Status: DC

## 2013-02-18 MED ORDER — ALPRAZOLAM 2 MG PO TABS: 2.0000 mg | ORAL_TABLET | Freq: Two times a day (BID) | ORAL | Status: DC

## 2013-02-18 MED ORDER — LAMOTRIGINE 25 MG PO TABS: 50.0000 mg | ORAL_TABLET | Freq: Every day | ORAL | Status: DC

## 2013-02-18 NOTE — Progress Notes (Signed)
Pre visit review using our clinic review tool, if applicable. No additional management support is needed unless otherwise documented below in the visit note. 

## 2013-02-18 NOTE — Progress Notes (Signed)
   Subjective:    Patient ID: Emily Avery, female    DOB: Feb 14, 1960, 53 y.o.   MRN: [id number]  HPI Here for several issues. First she has been sick for 10 days with stuffy head, PND, chest tightness and a dry cough. Also she has had some memory problems and she thinks this may be due to the Ambien she takes. Also she has been very stressed lately with anxiety and depression. She has not been able to find a job for a long time now. She lost her home and nwo she is living on her mother's property.    Review of Systems  Constitutional: Negative.   HENT: Positive for congestion and postnasal drip.   Eyes: Negative.   Respiratory: Positive for cough and chest tightness.   Psychiatric/Behavioral: Positive for dysphoric mood and decreased concentration. Negative for confusion. The patient is nervous/anxious.        Objective:   Physical Exam  Constitutional: She appears well-developed and well-nourished.  HENT:  Right Ear: External ear normal.  Left Ear: External ear normal.  Nose: Nose normal.  Mouth/Throat: Oropharynx is clear and moist.  Eyes: Conjunctivae are normal.  Pulmonary/Chest: Effort normal. No respiratory distress. She has no rales.  Scattered wheezes and rhonchi   Lymphadenopathy:    She has no cervical adenopathy.  Psychiatric: Her behavior is normal. Thought content normal.  Tearful           Assessment & Plan:  I agree that some of her memory problems could be stemming form the Ambien, so we will stop this. Increase Trazodone to 100 mg a day and increase Lamictal to 50 mg a day, dosing both of these at bedtime. Get fasting labs today. Treat the bronchitis with a Zpack.

## 2013-02-19 ENCOUNTER — Telehealth: Payer: Self-pay | Admitting: Family Medicine

## 2013-02-19 NOTE — Telephone Encounter (Signed)
Relevant patient education assigned to patient using Emmi. ° °

## 2013-02-25 ENCOUNTER — Telehealth: Payer: Self-pay | Admitting: Family Medicine

## 2013-02-25 NOTE — Telephone Encounter (Signed)
Relevant patient education assigned to patient using Emmi. ° °

## 2013-03-11 ENCOUNTER — Emergency Department (HOSPITAL_COMMUNITY): Payer: Self-pay

## 2013-03-11 ENCOUNTER — Emergency Department (HOSPITAL_COMMUNITY)
Admission: EM | Admit: 2013-03-11 | Discharge: 2013-03-11 | Disposition: A | Discharge status: Home / Self Care | Payer: Self-pay | Attending: Emergency Medicine | Admitting: Emergency Medicine

## 2013-03-11 ENCOUNTER — Encounter (HOSPITAL_COMMUNITY): Payer: Self-pay | Admitting: Emergency Medicine

## 2013-03-11 DIAGNOSIS — N200 Calculus of kidney: Secondary | ICD-10-CM | POA: Insufficient documentation

## 2013-03-11 DIAGNOSIS — F17200 Nicotine dependence, unspecified, uncomplicated: Secondary | ICD-10-CM | POA: Insufficient documentation

## 2013-03-11 DIAGNOSIS — M810 Age-related osteoporosis without current pathological fracture: Secondary | ICD-10-CM | POA: Insufficient documentation

## 2013-03-11 DIAGNOSIS — Z87442 Personal history of urinary calculi: Secondary | ICD-10-CM | POA: Insufficient documentation

## 2013-03-11 DIAGNOSIS — I10 Essential (primary) hypertension: Secondary | ICD-10-CM | POA: Insufficient documentation

## 2013-03-11 DIAGNOSIS — Z8719 Personal history of other diseases of the digestive system: Secondary | ICD-10-CM | POA: Insufficient documentation

## 2013-03-11 DIAGNOSIS — Z79899 Other long term (current) drug therapy: Secondary | ICD-10-CM | POA: Insufficient documentation

## 2013-03-11 DIAGNOSIS — F3289 Other specified depressive episodes: Secondary | ICD-10-CM | POA: Insufficient documentation

## 2013-03-11 DIAGNOSIS — E785 Hyperlipidemia, unspecified: Secondary | ICD-10-CM | POA: Insufficient documentation

## 2013-03-11 DIAGNOSIS — Z3202 Encounter for pregnancy test, result negative: Secondary | ICD-10-CM | POA: Insufficient documentation

## 2013-03-11 DIAGNOSIS — Z791 Long term (current) use of non-steroidal anti-inflammatories (NSAID): Secondary | ICD-10-CM | POA: Insufficient documentation

## 2013-03-11 DIAGNOSIS — F329 Major depressive disorder, single episode, unspecified: Secondary | ICD-10-CM | POA: Insufficient documentation

## 2013-03-11 LAB — COMPREHENSIVE METABOLIC PANEL
ALT: 16 U/L (ref 0–35)
AST: 16 U/L (ref 0–37)
Albumin: 4 g/dL (ref 3.5–5.2)
Alkaline Phosphatase: 128 U/L — ABNORMAL HIGH (ref 39–117)
BUN: 18 mg/dL (ref 6–23)
CO2: 24 mEq/L (ref 19–32)
Calcium: 9.2 mg/dL (ref 8.4–10.5)
Chloride: 101 mEq/L (ref 96–112)
Creatinine, Ser: 0.72 mg/dL (ref 0.50–1.10)
GFR calc Af Amer: >90 mL/min (ref >90)
GFR calc non Af Amer: >90 mL/min (ref >90)
Glucose, Bld: 136 mg/dL — ABNORMAL HIGH (ref 70–99)
Potassium: 3.5 mEq/L — ABNORMAL LOW (ref 3.7–5.3)
Sodium: 140 mEq/L (ref 137–147)
Total Bilirubin: <0.2 mg/dL — ABNORMAL LOW (ref 0.3–1.2)
Total Protein: 7.5 g/dL (ref 6.0–8.3)

## 2013-03-11 LAB — URINE MICROSCOPIC-ADD ON

## 2013-03-11 LAB — URINALYSIS, ROUTINE W REFLEX MICROSCOPIC
Bilirubin Urine: NEGATIVE
Glucose, UA: NEGATIVE mg/dL
Ketones, ur: NEGATIVE mg/dL
Leukocytes, UA: NEGATIVE
Nitrite: NEGATIVE
Protein, ur: NEGATIVE mg/dL
Specific Gravity, Urine: 1.026 (ref 1.005–1.030)
Urobilinogen, UA: 0.2 mg/dL (ref 0.0–1.0)
pH: 5 (ref 5.0–8.0)

## 2013-03-11 LAB — CBC WITH DIFFERENTIAL/PLATELET
Basophils Absolute: 0 10*3/uL (ref 0.0–0.1)
Basophils Relative: 0 % (ref 0–1)
Eosinophils Absolute: 0.1 10*3/uL (ref 0.0–0.7)
Eosinophils Relative: 1 % (ref 0–5)
HCT: 44.9 % (ref 36.0–46.0)
Hemoglobin: 15.6 g/dL — ABNORMAL HIGH (ref 12.0–15.0)
Lymphocytes Relative: 27 % (ref 12–46)
Lymphs Abs: 4.1 10*3/uL — ABNORMAL HIGH (ref 0.7–4.0)
MCH: 32.2 pg (ref 26.0–34.0)
MCHC: 34.7 g/dL (ref 30.0–36.0)
MCV: 92.8 fL (ref 78.0–100.0)
Monocytes Absolute: 0.7 10*3/uL (ref 0.1–1.0)
Monocytes Relative: 4 % (ref 3–12)
Neutro Abs: 10.4 10*3/uL — ABNORMAL HIGH (ref 1.7–7.7)
Neutrophils Relative %: 68 % (ref 43–77)
Platelets: 337 10*3/uL (ref 150–400)
RBC: 4.84 MIL/uL (ref 3.87–5.11)
RDW: 12.9 % (ref 11.5–15.5)
WBC: 15.3 10*3/uL — ABNORMAL HIGH (ref 4.0–10.5)

## 2013-03-11 LAB — PREGNANCY, URINE: Preg Test, Ur: NEGATIVE

## 2013-03-11 LAB — LIPASE, BLOOD: Lipase: 25 U/L (ref 11–59)

## 2013-03-11 MED ORDER — KETOROLAC TROMETHAMINE 30 MG/ML IJ SOLN
30.0000 mg | Freq: Once | INTRAMUSCULAR | Status: AC
  Administered 2013-03-11: 30 mg via INTRAVENOUS
  Filled 2013-03-11: qty 1

## 2013-03-11 MED ORDER — ONDANSETRON HCL 4 MG/2ML IJ SOLN
4.0000 mg | Freq: Once | INTRAMUSCULAR | Status: AC
Start: 2013-03-11 — End: 2013-03-11
  Administered 2013-03-11: 4 mg via INTRAVENOUS
  Filled 2013-03-11: qty 2

## 2013-03-11 MED ORDER — CEPHALEXIN 500 MG PO CAPS: 500.0000 mg | ORAL_CAPSULE | Freq: Four times a day (QID) | ORAL | Status: DC

## 2013-03-11 MED ORDER — PROMETHAZINE HCL 25 MG PO TABS: 25.0000 mg | ORAL_TABLET | Freq: Four times a day (QID) | ORAL | Status: DC | PRN

## 2013-03-11 MED ORDER — SODIUM CHLORIDE 0.9 % IV BOLUS (SEPSIS)
1000.0000 mL | Freq: Once | INTRAVENOUS | Status: AC
  Administered 2013-03-11: 1000 mL via INTRAVENOUS

## 2013-03-11 MED ORDER — NAPROXEN 500 MG PO TABS: 500.0000 mg | ORAL_TABLET | Freq: Two times a day (BID) | ORAL | Status: DC

## 2013-03-11 MED ORDER — PROMETHAZINE HCL 25 MG/ML IJ SOLN
25.0000 mg | Freq: Once | INTRAMUSCULAR | Status: AC
  Administered 2013-03-11: 25 mg via INTRAVENOUS
  Filled 2013-03-11: qty 1

## 2013-03-11 MED ORDER — HYDROMORPHONE HCL PF 1 MG/ML IJ SOLN
1.0000 mg | Freq: Once | INTRAMUSCULAR | Status: AC
  Administered 2013-03-11: 1 mg via INTRAVENOUS
  Filled 2013-03-11: qty 1

## 2013-03-11 NOTE — ED Notes (Signed)
Patient transported to CT 

## 2013-03-11 NOTE — Discharge Instructions (Signed)
Take Naprosyn as needed for pain. Take Phenergan as needed for nausea. Refer to attached documents for more information.

## 2013-03-11 NOTE — ED Notes (Addendum)
Pt presents to [facility] with c/o right flank pain. Denies CP. Denies hematuria or other urinary symptoms. A&ox4. Denies strenuous activity or heavy lifting.  Hx of kidney stones.

## 2013-03-11 NOTE — ED Provider Notes (Signed)
CSN: [id number]     Arrival date & time 03/11/13  5176 History   First MD Initiated Contact with Patient 03/11/13 [phone]     Chief Complaint  Patient presents with  . Flank Pain     (Consider location/radiation/quality/duration/timing/severity/associated sxs/prior Treatment) HPI Comments: Patient is a 53 year old female with a past medical history of hypertension, kidney stones, and diverticulitis who presents with right flank pain for the past 3 hours. The pain is located in her right flank and radiates down to her RLQ. The pain is described as sharp and severe. The pain started gradually and progressively worsened since the onset. No alleviating/aggravating factors. The patient has tried nothing for symptoms without relief. Associated symptoms include nausea. Patient denies fever, headache, vomiting, diarrhea, chest pain, SOB, dysuria, constipation, abnormal vaginal bleeding/discharge.     Patient is a 53 y.o. female presenting with flank pain.  Flank Pain Associated symptoms include nausea. Pertinent negatives include no abdominal pain, arthralgias, chest pain, chills, fatigue, fever, neck pain, vomiting or weakness.    Past Medical History  Diagnosis Date  . Depression   . Hypertension   . Osteoporosis   . GERD (gastroesophageal reflux disease)   . Overactive bladder   . Kidney stones   . Diverticulitis of colon   . Hyperlipidemia    Past Surgical History  Procedure Laterality Date  . Colonoscopy  06-25-09    per Dr. Fuller Plan, benign polyps, repeat in 5 yrs    Family History  Problem Relation Age of Onset  . Alcohol abuse    . Arthritis    . Diabetes    . Hypertension    . Heart disease    . Colon cancer     History  Substance Use Topics  . Smoking status: Current Every Day Smoker -- 35 years    Types: Cigarettes  . Smokeless tobacco: Never Used     Comment: less than 1 pack each day  . Alcohol Use: Yes     Comment: rare   OB History   Grav Para Term Preterm  Abortions TAB SAB Ect Mult Living                 Review of Systems  Constitutional: Negative for fever, chills and fatigue.  HENT: Negative for trouble swallowing.   Eyes: Negative for visual disturbance.  Respiratory: Negative for shortness of breath.   Cardiovascular: Negative for chest pain and palpitations.  Gastrointestinal: Positive for nausea. Negative for vomiting, abdominal pain and diarrhea.  Genitourinary: Positive for flank pain. Negative for dysuria and difficulty urinating.  Musculoskeletal: Negative for arthralgias and neck pain.  Skin: Negative for color change.  Neurological: Negative for dizziness and weakness.  Psychiatric/Behavioral: Negative for dysphoric mood.      Allergies  Codeine  Home Medications   Current Outpatient Rx  Name  Route  Sig  Dispense  Refill  . alprazolam (XANAX) 2 MG tablet   Oral   Take 1 tablet (2 mg total) by mouth 2 (two) times daily.   180 tablet   1   . atorvastatin (LIPITOR) 20 MG tablet      TAKE ONE TABLET BY MOUTH ONCE DAILY (NEEDS  OFFICE  VISIT)  PLEASE  SCHEDULE  AN  APPOINTMENT  FOR  FASTING  LABS  FOR  MORE  REFILLS   90 tablet   3   . azithromycin (ZITHROMAX) 250 MG tablet      As directed   6 tablet  0   . CHANTIX CONTINUING MONTH PAK 1 MG tablet      TAKE 1 TABLET BY MOUTH 2 TIMES DAILY   56 tablet   1   . chlorpheniramine-HYDROcodone (TUSSIONEX PENNKINETIC ER) 10-8 MG/5ML LQCR   Oral   Take 5 mLs by mouth every 12 (twelve) hours as needed.   240 mL   0   . cyclobenzaprine (FLEXERIL) 10 MG tablet      TAKE ONE TABLET BY MOUTH THREE TIMES DAILY AS NEEDED FOR MUSCLE SPASM   90 tablet   5   . dicyclomine (BENTYL) 20 MG tablet   Oral   Take 1 tablet (20 mg total) by mouth every 8 (eight) hours as needed.   270 tablet   3   . lamoTRIgine (LAMICTAL) 25 MG tablet   Oral   Take 2 tablets (50 mg total) by mouth at bedtime.   180 tablet   3   . lisinopril (PRINIVIL,ZESTRIL) 10 MG tablet    Oral   Take 1 tablet (10 mg total) by mouth daily.   90 tablet   3   . meloxicam (MOBIC) 15 MG tablet   Oral   Take 1 tablet (15 mg total) by mouth daily.   90 tablet   3   . methylphenidate (RITALIN LA) 20 MG 24 hr capsule   Oral   Take 1 capsule (20 mg total) by mouth every morning.   30 capsule   0   . sertraline (ZOLOFT) 100 MG tablet   Oral   Take 1 tablet (100 mg total) by mouth 2 (two) times daily.   180 tablet   3   . traZODone (DESYREL) 100 MG tablet   Oral   Take 1 tablet (100 mg total) by mouth at bedtime.   90 tablet   3   . varenicline (CHANTIX STARTING MONTH PAK) 0.5 MG X 11 & 1 MG X 42 tablet      Take one 0.5 mg tablet by mouth once daily for 3 days, then increase to one 0.5 mg tablet twice daily for 4 days, then increase to one 1 mg tablet twice daily.   53 tablet   0    BP 155/94  Pulse 87  Temp(Src) 97.9 F (36.6 C) (Oral)  Resp 20  SpO2 99% Physical Exam  Nursing note and vitals reviewed. Constitutional: She is oriented to person, place, and time. She appears well-developed and well-nourished. No distress.  HENT:  Head: Normocephalic and atraumatic.  Eyes: Conjunctivae and EOM are normal.  Neck: Normal range of motion.  Cardiovascular: Normal rate and regular rhythm.  Exam reveals no gallop and no friction rub.   No murmur heard. Pulmonary/Chest: Effort normal and breath sounds normal. She has no wheezes. She has no rales. She exhibits no tenderness.  Abdominal: Soft. She exhibits no distension. There is tenderness. There is no rebound and no guarding.  RLQ tenderness to palpation. Patient also has RUQ tenderness to palpation but RLQ>RUQ. No peritoneal signs or focal tenderness to palpation.   Genitourinary:  No CVA tenderness.   Musculoskeletal: Normal range of motion.  Neurological: She is alert and oriented to person, place, and time. Coordination normal.  Speech is goal-oriented. Moves limbs without ataxia.   Skin: Skin is warm and  dry.  Psychiatric: She has a normal mood and affect. Her behavior is normal.    ED Course  Procedures (including critical care time) Labs Review Labs Reviewed  CBC WITH DIFFERENTIAL -  Abnormal; Notable for the following:    WBC 15.3 (*)    Hemoglobin 15.6 (*)    Neutro Abs 10.4 (*)    Lymphs Abs 4.1 (*)    All other components within normal limits  COMPREHENSIVE METABOLIC PANEL - Abnormal; Notable for the following:    Potassium 3.5 (*)    Glucose, Bld 136 (*)    Alkaline Phosphatase 128 (*)    Total Bilirubin <0.2 (*)    All other components within normal limits  URINALYSIS, ROUTINE W REFLEX MICROSCOPIC - Abnormal; Notable for the following:    APPearance TURBID (*)    Hgb urine dipstick SMALL (*)    All other components within normal limits  URINE MICROSCOPIC-ADD ON - Abnormal; Notable for the following:    Squamous Epithelial / LPF MANY (*)    Bacteria, UA MANY (*)    All other components within normal limits  LIPASE, BLOOD  PREGNANCY, URINE    Imaging Review Ct Abdomen Pelvis Wo Contrast  03/11/2013   CLINICAL DATA:  Right flank pain and nausea, history of nephrolithiasis  EXAM: CT ABDOMEN AND PELVIS WITHOUT CONTRAST  TECHNIQUE: Multidetector CT imaging of the abdomen and pelvis was performed following the standard protocol without IV contrast.  COMPARISON:  None.  FINDINGS: Minor bibasilar atelectasis. Normal heart size. No pericardial or pleural effusion.  Abdomen: Right kidney demonstrates perinephric inflammation/ edema and mild hydronephrosis. Diffusion associated right hydroureter. This is secondary to a mildly obstructing right distal UVJ 3 mm calculus on image 85, about to past into the bladder. There are additional punctate nonobstructing right intrarenal calculi in the mid and lower pole regions.  Left kidney and ureter demonstrate no acute obstructing ureteral calculus or uropathy. Tiny punctate nonobstructing left midpole intrarenal calculus noted also, image 37.   Liver, gallbladder, biliary system, pancreas, spleen, and adrenal glands are within normal limits for age and noncontrast imaging.  Negative for bowel obstruction, dilatation, ileus, or free air.  Normal appendix.  Pelvis: No pelvic free fluid, fluid collection, hemorrhage, abscess, adenopathy, inguinal abnormality, or hernia. No acute distal bowel process. Colon is collapsed. Scattered minor sigmoid diverticulosis.  Minor degenerative changes of the spine.  IMPRESSION: 3 mm right distal UVJ mildly obstructing calculus with associated right mild hydronephrosis, hydroureter, and perinephric inflammation  Additional punctate nonobstructing intrarenal calculi bilaterally   Electronically Signed   By: Daryll Brod M.D.   On: 03/11/2013 08:49    EKG Interpretation   None       MDM   Final diagnoses:  Kidney stone    6:48 AM Patient complaining of right flank pain but is tender in the RLQ. Patient will have labs and urinalysis. Patient will have toradol and zofran for pain. Patient will have CT abdomen pelvis to evaluate for kidney stone. Appendicitis is also likely and will likely be seen on CT without contrast due to large amount of adipose tissue of abdomen.   9:38 AM Patient's CT shows a 53mm stone at the UVJ on the right. Labs show elevated WBC and urine shows some hemoglobin with many bacteria and epithelial cells, likely a contaminated sample. Patient reports controlled pain with toradol and dilaudid. Vital stable and patient afebrile. Patient will be discharged with pain and nausea medication. No further evaluation needed at this time.   10:35 AM Patient will have keflex for possible infected stone.     Alvina Chou, PA-C 03/11/13 [address], [place] 03/11/13 1036

## 2013-03-12 LAB — URINE CULTURE: Colony Count: >=100000

## 2013-03-16 NOTE — ED Provider Notes (Signed)
Medical screening examination/treatment/procedure(s) were performed by non-physician practitioner and as supervising physician I was immediately available for consultation/collaboration.  EKG Interpretation   None        Leota Jacobsen, MD 03/16/13 [phone]

## 2013-03-17 ENCOUNTER — Encounter: Payer: Self-pay | Admitting: Family Medicine

## 2013-03-28 ENCOUNTER — Other Ambulatory Visit: Payer: Self-pay | Admitting: Obstetrics and Gynecology

## 2013-04-29 ENCOUNTER — Telehealth: Payer: Self-pay | Admitting: Family Medicine

## 2013-04-29 NOTE — Telephone Encounter (Signed)
Pt called to have her ADHD Ritalin refill. Pt said she has went back to work and that is why she needs this medicine. Pharmacy; [facility] .

## 2013-04-29 NOTE — Telephone Encounter (Signed)
I left a voice message stating that Dr. Sarajane Jews is will be returning on 05/05/13. It looks like pt last took this about 1 year ago, will have to wait until next week to address this.

## 2013-05-06 MED ORDER — METHYLPHENIDATE HCL ER (LA) 20 MG PO CP24: 20.0000 mg | ORAL_CAPSULE | ORAL | Status: DC

## 2013-05-06 NOTE — Telephone Encounter (Signed)
Script is ready for pick up and I spoke with pt.  

## 2013-05-06 NOTE — Telephone Encounter (Signed)
Written for one month

## 2013-09-12 ENCOUNTER — Other Ambulatory Visit: Payer: Self-pay | Admitting: Family Medicine

## 2013-09-12 NOTE — Telephone Encounter (Signed)
Okay to fill? 

## 2013-09-12 NOTE — Telephone Encounter (Signed)
Call in [place] #90 with 5 rf, also Xanax #180 with one rf

## 2013-09-16 ENCOUNTER — Encounter: Payer: Self-pay | Admitting: Gastroenterology

## 2013-11-20 HISTORY — PX: LUMBAR DISC SURGERY: SHX700

## 2014-02-23 ENCOUNTER — Other Ambulatory Visit: Payer: Self-pay | Admitting: Family Medicine

## 2014-02-23 NOTE — Telephone Encounter (Signed)
Call in 30 days of each with no rf. She needs an OV soon

## 2014-03-20 ENCOUNTER — Other Ambulatory Visit: Payer: Self-pay | Admitting: Family Medicine

## 2014-03-23 ENCOUNTER — Telehealth: Payer: Self-pay | Admitting: *Deleted

## 2014-03-23 NOTE — Telephone Encounter (Signed)
Please call pt once rxs has been sent to pharm. Pt needs new rx ritalin

## 2014-03-23 NOTE — Telephone Encounter (Signed)
Call in #60 only. She needs an OV soon

## 2014-03-23 NOTE — Telephone Encounter (Signed)
Pt called requesting a refill on [place] and she also has 2 with piror autho xanxa and tresadone pt uses [facility]

## 2014-03-24 NOTE — Telephone Encounter (Signed)
Please schedule

## 2014-03-24 NOTE — Telephone Encounter (Signed)
She needs an OV for this. We have not seen her in over a year

## 2014-03-24 NOTE — Telephone Encounter (Addendum)
Pt said she can not come in right now due to starting  new job. Pt would like to know if md could do an  Emergency rxs

## 2014-03-24 NOTE — Telephone Encounter (Signed)
lmom for pt to cb

## 2014-03-25 MED ORDER — TRAZODONE HCL 100 MG PO TABS: 100.0000 mg | ORAL_TABLET | Freq: Every day | ORAL | Status: DC

## 2014-03-25 MED ORDER — ALPRAZOLAM 2 MG PO TABS: 2.0000 mg | ORAL_TABLET | Freq: Two times a day (BID) | ORAL | Status: DC

## 2014-03-25 MED ORDER — METHYLPHENIDATE HCL ER (LA) 20 MG PO CP24: 20.0000 mg | ORAL_CAPSULE | ORAL | Status: DC

## 2014-03-25 NOTE — Telephone Encounter (Signed)
Scripts are ready for pick up and I left a voice message for pt.

## 2014-03-25 NOTE — Addendum Note (Signed)
Addended by: Alysia Penna A on: 03/25/2014 12:37 PM   Modules accepted: Orders

## 2014-03-25 NOTE — Telephone Encounter (Signed)
These were printed out for ONE month only

## 2014-04-03 ENCOUNTER — Other Ambulatory Visit: Payer: Self-pay | Admitting: Obstetrics and Gynecology

## 2014-04-07 LAB — CYTOLOGY - PAP

## 2014-04-13 ENCOUNTER — Ambulatory Visit (INDEPENDENT_AMBULATORY_CARE_PROVIDER_SITE_OTHER): Payer: Self-pay | Admitting: Family Medicine

## 2014-04-13 ENCOUNTER — Encounter: Payer: Self-pay | Admitting: Family Medicine

## 2014-04-13 VITALS — BP 111/76 | HR 99 | Temp 98.0°F | Ht 62.75 in | Wt 182.0 lb

## 2014-04-13 DIAGNOSIS — K219 Gastro-esophageal reflux disease without esophagitis: Secondary | ICD-10-CM

## 2014-04-13 DIAGNOSIS — E119 Type 2 diabetes mellitus without complications: Secondary | ICD-10-CM

## 2014-04-13 DIAGNOSIS — M159 Polyosteoarthritis, unspecified: Secondary | ICD-10-CM

## 2014-04-13 DIAGNOSIS — I10 Essential (primary) hypertension: Secondary | ICD-10-CM

## 2014-04-13 DIAGNOSIS — M8949 Other hypertrophic osteoarthropathy, multiple sites: Secondary | ICD-10-CM

## 2014-04-13 DIAGNOSIS — M150 Primary generalized (osteo)arthritis: Secondary | ICD-10-CM

## 2014-04-13 MED ORDER — MELOXICAM 15 MG PO TABS: 15.0000 mg | ORAL_TABLET | Freq: Every day | ORAL | Status: DC

## 2014-04-13 MED ORDER — TRAZODONE HCL 100 MG PO TABS: 100.0000 mg | ORAL_TABLET | Freq: Every day | ORAL | Status: DC

## 2014-04-13 MED ORDER — ATORVASTATIN CALCIUM 20 MG PO TABS: 20.0000 mg | ORAL_TABLET | Freq: Every day | ORAL | Status: DC

## 2014-04-13 MED ORDER — NAPROXEN 500 MG PO TABS: 500.0000 mg | ORAL_TABLET | Freq: Two times a day (BID) | ORAL | Status: DC

## 2014-04-13 MED ORDER — CYCLOBENZAPRINE HCL 10 MG PO TABS
ORAL_TABLET | ORAL | Status: DC
Start: 2014-04-13 — End: 2015-02-24

## 2014-04-13 MED ORDER — SERTRALINE HCL 100 MG PO TABS: 100.0000 mg | ORAL_TABLET | Freq: Two times a day (BID) | ORAL | Status: DC

## 2014-04-13 MED ORDER — LISINOPRIL 10 MG PO TABS: 10.0000 mg | ORAL_TABLET | Freq: Every day | ORAL | Status: DC

## 2014-04-13 MED ORDER — LAMOTRIGINE 25 MG PO TABS: 50.0000 mg | ORAL_TABLET | Freq: Every day | ORAL | Status: DC

## 2014-04-13 NOTE — Progress Notes (Signed)
Pre visit review using our clinic review tool, if applicable. No additional management support is needed unless otherwise documented below in the visit note. 

## 2014-04-13 NOTE — Progress Notes (Signed)
   Subjective:    Patient ID: Emily Avery, female    DOB: 1960-08-24, 54 y.o.   MRN: [id number]  HPI Here to follow up. After we adjusted her meds for anxiety last year she has done well. She had back surgery last fall and this went well.    Review of Systems  Constitutional: Negative.   Respiratory: Negative.   Cardiovascular: Negative.   Psychiatric/Behavioral: Negative.        Objective:   Physical Exam  Constitutional: She appears well-developed and well-nourished.  Cardiovascular: Normal rate, regular rhythm, normal heart sounds and intact distal pulses.   Pulmonary/Chest: Effort normal and breath sounds normal.  Psychiatric: She has a normal mood and affect. Her behavior is normal. Thought content normal.          Assessment & Plan:  Refilled her meds. She will set up fasting labs soon.

## 2014-04-14 ENCOUNTER — Other Ambulatory Visit (INDEPENDENT_AMBULATORY_CARE_PROVIDER_SITE_OTHER): Payer: Self-pay

## 2014-04-14 DIAGNOSIS — E119 Type 2 diabetes mellitus without complications: Secondary | ICD-10-CM

## 2014-04-14 LAB — CBC WITH DIFFERENTIAL/PLATELET
Basophils Absolute: 0.1 10*3/uL (ref 0.0–0.1)
Basophils Relative: 0.5 % (ref 0.0–3.0)
Eosinophils Absolute: 0.2 10*3/uL (ref 0.0–0.7)
Eosinophils Relative: 2 % (ref 0.0–5.0)
HCT: 44 % (ref 36.0–46.0)
Hemoglobin: 14.7 g/dL (ref 12.0–15.0)
Lymphocytes Relative: 39.9 % (ref 12.0–46.0)
Lymphs Abs: 4.3 10*3/uL — ABNORMAL HIGH (ref 0.7–4.0)
MCHC: 33.5 g/dL (ref 30.0–36.0)
MCV: 91.9 fl (ref 78.0–100.0)
Monocytes Absolute: 0.7 10*3/uL (ref 0.1–1.0)
Monocytes Relative: 6.6 % (ref 3.0–12.0)
Neutro Abs: 5.5 10*3/uL (ref 1.4–7.7)
Neutrophils Relative %: 51 % (ref 43.0–77.0)
Platelets: 393 10*3/uL (ref 150.0–400.0)
RBC: 4.8 Mil/uL (ref 3.87–5.11)
RDW: 13.1 % (ref 11.5–15.5)
WBC: 10.7 10*3/uL — ABNORMAL HIGH (ref 4.0–10.5)

## 2014-04-14 LAB — TSH: TSH: 2.69 u[IU]/mL (ref 0.35–4.50)

## 2014-04-14 LAB — HEPATIC FUNCTION PANEL
ALT: 18 U/L (ref 0–35)
AST: 17 U/L (ref 0–37)
Albumin: 4.1 g/dL (ref 3.5–5.2)
Alkaline Phosphatase: 143 U/L — ABNORMAL HIGH (ref 39–117)
Bilirubin, Direct: 0 mg/dL (ref 0.0–0.3)
Total Bilirubin: 0.2 mg/dL (ref 0.2–1.2)
Total Protein: 7.2 g/dL (ref 6.0–8.3)

## 2014-04-14 LAB — LIPID PANEL
Cholesterol: 149 mg/dL (ref 0–200)
HDL: 46.2 mg/dL (ref >39.00)
LDL Cholesterol: 84 mg/dL (ref 0–99)
NonHDL: 102.8
Total CHOL/HDL Ratio: 3
Triglycerides: 96 mg/dL (ref 0.0–149.0)
VLDL: 19.2 mg/dL (ref 0.0–40.0)

## 2014-04-14 LAB — BASIC METABOLIC PANEL
BUN: 19 mg/dL (ref 6–23)
CO2: 29 mEq/L (ref 19–32)
Calcium: 9.4 mg/dL (ref 8.4–10.5)
Chloride: 104 mEq/L (ref 96–112)
Creatinine, Ser: 0.69 mg/dL (ref 0.40–1.20)
GFR: 95.01 mL/min (ref >60.00)
Glucose, Bld: 81 mg/dL (ref 70–99)
Potassium: 4.5 mEq/L (ref 3.5–5.1)
Sodium: 138 mEq/L (ref 135–145)

## 2014-04-29 ENCOUNTER — Emergency Department (HOSPITAL_COMMUNITY): Payer: Self-pay

## 2014-04-29 ENCOUNTER — Emergency Department (HOSPITAL_COMMUNITY)
Admission: EM | Admit: 2014-04-29 | Discharge: 2014-04-29 | Disposition: A | Discharge status: Home / Self Care | Payer: Self-pay | Attending: Emergency Medicine | Admitting: Emergency Medicine

## 2014-04-29 ENCOUNTER — Encounter (HOSPITAL_COMMUNITY): Payer: Self-pay | Admitting: *Deleted

## 2014-04-29 DIAGNOSIS — Z8739 Personal history of other diseases of the musculoskeletal system and connective tissue: Secondary | ICD-10-CM | POA: Insufficient documentation

## 2014-04-29 DIAGNOSIS — N201 Calculus of ureter: Secondary | ICD-10-CM | POA: Insufficient documentation

## 2014-04-29 DIAGNOSIS — Z79899 Other long term (current) drug therapy: Secondary | ICD-10-CM | POA: Insufficient documentation

## 2014-04-29 DIAGNOSIS — E785 Hyperlipidemia, unspecified: Secondary | ICD-10-CM | POA: Insufficient documentation

## 2014-04-29 DIAGNOSIS — Z720 Tobacco use: Secondary | ICD-10-CM | POA: Insufficient documentation

## 2014-04-29 DIAGNOSIS — F329 Major depressive disorder, single episode, unspecified: Secondary | ICD-10-CM | POA: Insufficient documentation

## 2014-04-29 DIAGNOSIS — Z8719 Personal history of other diseases of the digestive system: Secondary | ICD-10-CM | POA: Insufficient documentation

## 2014-04-29 DIAGNOSIS — I10 Essential (primary) hypertension: Secondary | ICD-10-CM | POA: Insufficient documentation

## 2014-04-29 LAB — URINALYSIS, ROUTINE W REFLEX MICROSCOPIC
Bilirubin Urine: NEGATIVE
Glucose, UA: NEGATIVE mg/dL
Ketones, ur: NEGATIVE mg/dL
Leukocytes, UA: NEGATIVE
Nitrite: NEGATIVE
Protein, ur: NEGATIVE mg/dL
Specific Gravity, Urine: 1.014 (ref 1.005–1.030)
Urobilinogen, UA: 0.2 mg/dL (ref 0.0–1.0)
pH: 5.5 (ref 5.0–8.0)

## 2014-04-29 LAB — URINE MICROSCOPIC-ADD ON

## 2014-04-29 LAB — COMPREHENSIVE METABOLIC PANEL
ALT: 21 U/L (ref 0–35)
AST: 19 U/L (ref 0–37)
Albumin: 4 g/dL (ref 3.5–5.2)
Alkaline Phosphatase: 130 U/L — ABNORMAL HIGH (ref 39–117)
Anion gap: 6 (ref 5–15)
BUN: 20 mg/dL (ref 6–23)
CO2: 29 mmol/L (ref 19–32)
Calcium: 9 mg/dL (ref 8.4–10.5)
Chloride: 103 mmol/L (ref 96–112)
Creatinine, Ser: 0.86 mg/dL (ref 0.50–1.10)
GFR calc Af Amer: 89 mL/min — ABNORMAL LOW (ref >90)
GFR calc non Af Amer: 77 mL/min — ABNORMAL LOW (ref >90)
Glucose, Bld: 119 mg/dL — ABNORMAL HIGH (ref 70–99)
Potassium: 4.1 mmol/L (ref 3.5–5.1)
Sodium: 138 mmol/L (ref 135–145)
Total Bilirubin: 0.5 mg/dL (ref 0.3–1.2)
Total Protein: 6.9 g/dL (ref 6.0–8.3)

## 2014-04-29 LAB — CBC WITH DIFFERENTIAL/PLATELET
Basophils Absolute: 0 10*3/uL (ref 0.0–0.1)
Basophils Relative: 0 % (ref 0–1)
Eosinophils Absolute: 0 10*3/uL (ref 0.0–0.7)
Eosinophils Relative: 0 % (ref 0–5)
HCT: 42.9 % (ref 36.0–46.0)
Hemoglobin: 14.5 g/dL (ref 12.0–15.0)
Lymphocytes Relative: 13 % (ref 12–46)
Lymphs Abs: 2.1 10*3/uL (ref 0.7–4.0)
MCH: 31.1 pg (ref 26.0–34.0)
MCHC: 33.8 g/dL (ref 30.0–36.0)
MCV: 92.1 fL (ref 78.0–100.0)
Monocytes Absolute: 0.9 10*3/uL (ref 0.1–1.0)
Monocytes Relative: 5 % (ref 3–12)
Neutro Abs: 13.4 10*3/uL — ABNORMAL HIGH (ref 1.7–7.7)
Neutrophils Relative %: 82 % — ABNORMAL HIGH (ref 43–77)
Platelets: 349 10*3/uL (ref 150–400)
RBC: 4.66 MIL/uL (ref 3.87–5.11)
RDW: 12.9 % (ref 11.5–15.5)
WBC: 16.4 10*3/uL — ABNORMAL HIGH (ref 4.0–10.5)

## 2014-04-29 MED ORDER — HYDROMORPHONE HCL 1 MG/ML IJ SOLN
1.0000 mg | Freq: Once | INTRAMUSCULAR | Status: AC
  Administered 2014-04-29: 1 mg via INTRAVENOUS
  Filled 2014-04-29: qty 1

## 2014-04-29 MED ORDER — OXYCODONE-ACETAMINOPHEN 5-325 MG PO TABS
1.0000 | ORAL_TABLET | Freq: Once | ORAL | Status: AC
  Administered 2014-04-29: 1 via ORAL

## 2014-04-29 MED ORDER — ONDANSETRON 8 MG PO TBDP: 8.0000 mg | ORAL_TABLET | Freq: Three times a day (TID) | ORAL | Status: DC | PRN

## 2014-04-29 MED ORDER — SODIUM CHLORIDE 0.9 % IV BOLUS (SEPSIS)
1000.0000 mL | Freq: Once | INTRAVENOUS | Status: AC
  Administered 2014-04-29: 1000 mL via INTRAVENOUS

## 2014-04-29 MED ORDER — KETOROLAC TROMETHAMINE 30 MG/ML IJ SOLN
30.0000 mg | Freq: Once | INTRAMUSCULAR | Status: AC
  Administered 2014-04-29: 30 mg via INTRAVENOUS
  Filled 2014-04-29: qty 1

## 2014-04-29 MED ORDER — OXYCODONE-ACETAMINOPHEN 5-325 MG PO TABS: 1.0000 | ORAL_TABLET | ORAL | Status: DC | PRN

## 2014-04-29 MED ORDER — OXYCODONE-ACETAMINOPHEN 5-325 MG PO TABS
ORAL_TABLET | ORAL | Status: AC
  Administered 2014-04-29: 16:00:00
  Filled 2014-04-29: qty 1

## 2014-04-29 MED ORDER — IBUPROFEN 600 MG PO TABS: 600.0000 mg | ORAL_TABLET | Freq: Three times a day (TID) | ORAL | Status: DC | PRN

## 2014-04-29 MED ORDER — TAMSULOSIN HCL 0.4 MG PO CAPS: 0.4000 mg | ORAL_CAPSULE | Freq: Every day | ORAL | Status: DC

## 2014-04-29 MED ORDER — ONDANSETRON HCL 4 MG/2ML IJ SOLN
4.0000 mg | Freq: Once | INTRAMUSCULAR | Status: AC
  Administered 2014-04-29: 4 mg via INTRAVENOUS
  Filled 2014-04-29: qty 2

## 2014-04-29 NOTE — ED Notes (Signed)
Pt reports onset last night of right side back pain and right lower abd pain. Had blood in urine the past two days and hx of kidney stones.

## 2014-04-29 NOTE — Discharge Instructions (Signed)

## 2014-04-29 NOTE — ED Provider Notes (Signed)
CSN: [id number]     Arrival date & time 04/29/14  1450 History   First MD Initiated Contact with Patient 04/29/14 1537     Chief Complaint  Patient presents with  . Flank Pain  . Abdominal Pain     HPI Patient presents complaining of right flank pain with radiation around to her right abdomen.  She feels like this feels like her prior kidney stones.  His been over a year since she's had her last kidney stone.  She reports ongoing pain in her right lower quadrant.  No fevers or chills.  She does report nausea without vomiting.  She denies diarrhea.  Her pain is moderate to severe in severity at this time.  It seems to come and go.  No diarrhea.  No other complaints.  No dysuria or urinary frequency.  She does report hematuria.   Past Medical History  Diagnosis Date  . Depression   . Hypertension   . Osteoporosis   . GERD (gastroesophageal reflux disease)   . Overactive bladder   . Kidney stones   . Diverticulitis of colon   . Hyperlipidemia    Past Surgical History  Procedure Laterality Date  . Colonoscopy  06-25-09    per Dr. Fuller Plan, benign polyps, repeat in 5 yrs   . Lumbar disc surgery  11-20-13    per Dr. Arnoldo Morale, at L5-S1    Family History  Problem Relation Age of Onset  . Alcohol abuse    . Arthritis    . Diabetes    . Hypertension    . Heart disease    . Colon cancer     History  Substance Use Topics  . Smoking status: Current Every Day Smoker -- 35 years    Types: Cigarettes  . Smokeless tobacco: Never Used     Comment: 1 pack per day  . Alcohol Use: 0.0 oz/week    0 Standard drinks or equivalent per week     Comment: rare   OB History    No data available     Review of Systems  All other systems reviewed and are negative.     Allergies  Codeine  Home Medications   Prior to Admission medications   Medication Sig Start Date End Date Taking? Authorizing Provider  alprazolam [name]) 2 MG tablet Take 1 tablet (2 mg total) by mouth 2 (two) times daily.  03/25/14   Laurey Morale, MD  atorvastatin (LIPITOR) 20 MG tablet Take 1 tablet (20 mg total) by mouth daily. 04/13/14   Laurey Morale, MD  cyclobenzaprine (FLEXERIL) 10 MG tablet TAKE ONE TABLET BY MOUTH THREE TIMES DAILY AS NEEDED FOR MUSCLE SPASMS 04/13/14   Laurey Morale, MD  lamoTRIgine (LAMICTAL) 25 MG tablet Take 2 tablets (50 mg total) by mouth at bedtime. 04/13/14   Laurey Morale, MD  lisinopril (PRINIVIL,ZESTRIL) 10 MG tablet Take 1 tablet (10 mg total) by mouth daily. 04/13/14   Laurey Morale, MD  meloxicam (MOBIC) 15 MG tablet Take 1 tablet (15 mg total) by mouth daily. 04/13/14   Laurey Morale, MD  methylphenidate (RITALIN LA) 20 MG 24 hr capsule Take 1 capsule (20 mg total) by mouth every morning. 03/25/14   Laurey Morale, MD  naproxen (NAPROSYN) 500 MG tablet Take 1 tablet (500 mg total) by mouth 2 (two) times daily with a meal. 04/13/14   Laurey Morale, MD  promethazine (PHENERGAN) 25 MG tablet Take 1 tablet (25 mg total)  by mouth every 6 (six) hours as needed for nausea or vomiting. 03/11/13   Kaitlyn Szekalski, PA-C  sertraline (ZOLOFT) 100 MG tablet Take 1 tablet (100 mg total) by mouth 2 (two) times daily. 04/13/14   Laurey Morale, MD  traZODone (DESYREL) 100 MG tablet Take 1 tablet (100 mg total) by mouth at bedtime. 04/13/14   Laurey Morale, MD   BP 152/90 mmHg  Pulse 104  Temp(Src) 98.3 F (36.8 C)  Resp 18  SpO2 96%  LMP 04/22/2012 Physical Exam  Constitutional: She is oriented to person, place, and time. She appears well-developed and well-nourished. No distress.  HENT:  Head: Normocephalic and atraumatic.  Eyes: EOM are normal.  Neck: Normal range of motion.  Cardiovascular: Normal rate, regular rhythm and normal heart sounds.   Pulmonary/Chest: Effort normal and breath sounds normal.  Abdominal: Soft. She exhibits no distension.  Mild tenderness right lower quadrant.  Musculoskeletal: Normal range of motion.  Neurological: She is alert and oriented to person, place, and  time.  Skin: Skin is warm and dry.  Psychiatric: She has a normal mood and affect. Judgment normal.  Nursing note and vitals reviewed.   ED Course  Procedures (including critical care time) Labs Review Labs Reviewed  CBC WITH DIFFERENTIAL/PLATELET - Abnormal; Notable for the following:    WBC 16.4 (*)    Neutrophils Relative % 82 (*)    Neutro Abs 13.4 (*)    All other components within normal limits  COMPREHENSIVE METABOLIC PANEL - Abnormal; Notable for the following:    Glucose, Bld 119 (*)    Alkaline Phosphatase 130 (*)    GFR calc non Af Amer 77 (*)    GFR calc Af Amer 89 (*)    All other components within normal limits  URINALYSIS, ROUTINE W REFLEX MICROSCOPIC - Abnormal; Notable for the following:    Hgb urine dipstick LARGE (*)    All other components within normal limits  URINE MICROSCOPIC-ADD ON    Imaging Review Ct Abdomen Pelvis Wo Contrast  04/29/2014   CLINICAL DATA:  Right-side back and lower abdominal pain beginning last night. Blood in urine for the last 2 days.  EXAM: CT ABDOMEN AND PELVIS WITHOUT CONTRAST  TECHNIQUE: Multidetector CT imaging of the abdomen and pelvis was performed following the standard protocol without IV contrast.  COMPARISON:  03/11/2013.  FINDINGS: Lower chest:  Unremarkable.  Hepatobiliary: The liver has normal uninfused features. There is no evidence for gallstones, gallbladder wall thickening, or pericholecystic fluid. No intrahepatic or extrahepatic biliary dilation.  Pancreas: No focal mass lesion. No dilatation of the main duct. No intraparenchymal cyst. No peripancreatic edema.  Spleen: No splenomegaly. No focal mass lesion.  Adrenals/Urinary Tract: No adrenal nodule or mass. There is extensive right-sided perinephric edema/fluid with mild fullness of the right intrarenal collecting system. 3 x 4 x 5 mm stone is identified at or just below the right ureteropelvic junction. Remaining portions of the right ureter are nondistended without  evidence for stones.  1 mm nonobstructing stone is identified in the interpolar left kidney. No secondary changes in the left kidney or ureter. No left ureteral stones.  No bladder stones.  Stomach/Bowel: Stomach is nondistended. No gastric wall thickening. No evidence of outlet obstruction. Duodenum is normally positioned as is the ligament of Treitz. No small bowel wall thickening. No small bowel dilatation. Terminal ileum is normal. The appendix is normal. No gross colonic mass. No colonic wall thickening. No substantial diverticular change.  Vascular/Lymphatic:  There is abdominal aortic atherosclerosis without aneurysm. There is no abdominal lymphadenopathy. No pelvic sidewall lymphadenopathy.  Reproductive: In uterus is normal in appearance.  No adnexal mass.  Other: No intraperitoneal free fluid.  Musculoskeletal: Bone windows reveal no worrisome lytic or sclerotic osseous lesions.  IMPRESSION: 3 x 4 x 5 mm stone at or just below the right UPJ causes extensive secondary changes in and around the right kidney. There is no marked hydronephrosis although right intrarenal collecting system is full. Perinephric changes may be related to extensive edema although caliceal rupture can cause this appearance.   Electronically Signed   By: Misty Stanley M.D.   On: 04/29/2014 16:28     EKG Interpretation None      MDM   Final diagnoses:  Right ureteral stone    Patient feels much better at this time.  Discharge home in good condition.    Jola Schmidt, MD 04/29/14 1902

## 2014-05-20 ENCOUNTER — Other Ambulatory Visit: Payer: Self-pay | Admitting: Family Medicine

## 2014-05-20 ENCOUNTER — Telehealth: Payer: Self-pay | Admitting: Family Medicine

## 2014-05-20 NOTE — Telephone Encounter (Signed)
Pt needs refill on alprazolam 2 mg #90 w/refills send to [facility]

## 2014-05-20 NOTE — Telephone Encounter (Signed)
Call in Xanax #90 with 5 rf

## 2014-05-20 NOTE — Telephone Encounter (Signed)
Call in #30 with 11 rf 

## 2014-05-22 MED ORDER — ALPRAZOLAM 2 MG PO TABS: 2.0000 mg | ORAL_TABLET | Freq: Two times a day (BID) | ORAL | Status: DC | PRN

## 2014-05-22 MED ORDER — ALPRAZOLAM 2 MG PO TABS: 2.0000 mg | ORAL_TABLET | Freq: Every day | ORAL | Status: DC

## 2014-05-22 NOTE — Telephone Encounter (Signed)
It should be TID

## 2014-05-22 NOTE — Telephone Encounter (Signed)
Can you please clarify directions? Is it twice a day or three times a day?

## 2014-05-22 NOTE — Telephone Encounter (Signed)
Okay per Dr fry change back to read twice a day as needed. I called in new script and cancelled previous script, also spoke with pt.

## 2014-05-22 NOTE — Telephone Encounter (Signed)
I spoke with pt and she only takes this once a day. Per Dr. Sarajane Jews okay to change script to show this. I called in script and spoke with pt.

## 2014-05-25 ENCOUNTER — Other Ambulatory Visit: Payer: Self-pay | Admitting: Family Medicine

## 2014-05-26 NOTE — Telephone Encounter (Signed)
Refill all these for one year

## 2014-05-29 ENCOUNTER — Encounter: Payer: Self-pay | Admitting: Gastroenterology

## 2014-07-17 ENCOUNTER — Other Ambulatory Visit: Payer: Self-pay | Admitting: Family Medicine

## 2014-08-04 ENCOUNTER — Telehealth: Payer: Self-pay | Admitting: Family Medicine

## 2014-08-04 NOTE — Telephone Encounter (Signed)
She faxed over a ROI July 7th and was needed to get the last visit so they can go ahead and get her in.

## 2014-08-05 NOTE — Telephone Encounter (Signed)
This should go to medical records. I do not know what pt is talking about. Can you clarify more on this and then forward to medical records?

## 2014-08-19 ENCOUNTER — Other Ambulatory Visit: Payer: Self-pay | Admitting: Family Medicine

## 2014-08-19 NOTE — Telephone Encounter (Signed)
She was given a 6 month supply in April, so she already has a refill available

## 2014-09-23 ENCOUNTER — Telehealth: Payer: Self-pay | Admitting: Family Medicine

## 2014-09-23 NOTE — Telephone Encounter (Signed)
Pt call to ask for a rx on METHYLPHENIDATE RITALIN . Pt said she has a job and that is the reason she is asking for this med.

## 2014-09-24 MED ORDER — METHYLPHENIDATE HCL ER (LA) 20 MG PO CP24: 20.0000 mg | ORAL_CAPSULE | ORAL | Status: DC

## 2014-09-24 NOTE — Telephone Encounter (Signed)
Script is ready for pick up and I spoke with pt.  

## 2014-09-24 NOTE — Telephone Encounter (Signed)
done

## 2014-11-02 ENCOUNTER — Telehealth: Payer: Self-pay | Admitting: Family Medicine

## 2014-11-02 MED ORDER — METHYLPHENIDATE HCL ER (LA) 20 MG PO CP24: 20.0000 mg | ORAL_CAPSULE | ORAL | Status: DC

## 2014-11-02 NOTE — Telephone Encounter (Signed)
done

## 2014-11-02 NOTE — Telephone Encounter (Signed)
Script is ready for pick up and I spoke with pt.  

## 2014-11-02 NOTE — Telephone Encounter (Signed)
Pt is requesting refill on methylphenidate (RITALIN LA) 20 MG 24 hr capsule. Pt is asking for 3 month supply.

## 2014-11-09 ENCOUNTER — Other Ambulatory Visit: Payer: Self-pay | Admitting: Family Medicine

## 2014-11-19 ENCOUNTER — Other Ambulatory Visit: Payer: Self-pay | Admitting: Family Medicine

## 2014-11-20 NOTE — Telephone Encounter (Signed)
Call in #180 with one rf  

## 2015-02-05 MED FILL — METHYLPHENIDATE ER 20 MG CA: 20 | 30 days supply | Qty: 30 | Fill #0

## 2015-02-05 MED FILL — CYCLOBENZAPRINE 10 MG TAB: 10 | 30 days supply | Qty: 90 | Fill #0

## 2015-02-05 MED FILL — traZODone HCL 100 MG TABS: 100 | 30 days supply | Qty: 30 | Fill #0

## 2015-02-23 ENCOUNTER — Ambulatory Visit: Payer: Self-pay | Admitting: Family Medicine

## 2015-02-24 ENCOUNTER — Ambulatory Visit (INDEPENDENT_AMBULATORY_CARE_PROVIDER_SITE_OTHER): Payer: BLUE CROSS/BLUE SHIELD | Admitting: Family Medicine

## 2015-02-24 ENCOUNTER — Encounter: Payer: Self-pay | Admitting: Family Medicine

## 2015-02-24 VITALS — BP 107/57 | HR 91 | Temp 99.2°F | Ht 62.75 in | Wt 190.0 lb

## 2015-02-24 DIAGNOSIS — F32A Depression, unspecified: Secondary | ICD-10-CM

## 2015-02-24 DIAGNOSIS — J209 Acute bronchitis, unspecified: Secondary | ICD-10-CM

## 2015-02-24 DIAGNOSIS — F329 Major depressive disorder, single episode, unspecified: Secondary | ICD-10-CM

## 2015-02-24 DIAGNOSIS — F900 Attention-deficit hyperactivity disorder, predominantly inattentive type: Secondary | ICD-10-CM | POA: Diagnosis not present

## 2015-02-24 DIAGNOSIS — I10 Essential (primary) hypertension: Secondary | ICD-10-CM

## 2015-02-24 DIAGNOSIS — M479 Spondylosis, unspecified: Secondary | ICD-10-CM

## 2015-02-24 MED ORDER — METHYLPHENIDATE HCL ER (LA) 20 MG PO CP24: 20.0000 mg | ORAL_CAPSULE | ORAL | Status: DC

## 2015-02-24 MED ORDER — TRAZODONE HCL 100 MG PO TABS: 100.0000 mg | ORAL_TABLET | Freq: Every day | ORAL | Status: DC

## 2015-02-24 MED ORDER — AZITHROMYCIN 250 MG PO TABS: ORAL_TABLET | ORAL | Status: DC

## 2015-02-24 MED ORDER — ATORVASTATIN CALCIUM 20 MG PO TABS
20.0000 mg | ORAL_TABLET | Freq: Every day | ORAL | Status: DC
Start: 2015-02-24 — End: 2016-03-28

## 2015-02-24 MED ORDER — ALBUTEROL SULFATE HFA 108 (90 BASE) MCG/ACT IN AERS: 2.0000 | INHALATION_SPRAY | RESPIRATORY_TRACT | Status: DC | PRN

## 2015-02-24 MED ORDER — LISINOPRIL 10 MG PO TABS: 10.0000 mg | ORAL_TABLET | Freq: Every day | ORAL | Status: DC

## 2015-02-24 MED ORDER — CYCLOBENZAPRINE HCL 10 MG PO TABS: ORAL_TABLET | ORAL | Status: DC

## 2015-02-24 MED ORDER — LAMOTRIGINE 100 MG PO TABS: 100.0000 mg | ORAL_TABLET | Freq: Every day | ORAL | Status: DC

## 2015-02-24 MED ORDER — DICLOFENAC SODIUM 75 MG PO TBEC: 75.0000 mg | DELAYED_RELEASE_TABLET | Freq: Two times a day (BID) | ORAL | Status: DC

## 2015-02-24 MED ORDER — ALPRAZOLAM 2 MG PO TABS: 2.0000 mg | ORAL_TABLET | Freq: Two times a day (BID) | ORAL | Status: DC | PRN

## 2015-02-24 MED ORDER — SERTRALINE HCL 100 MG PO TABS: 100.0000 mg | ORAL_TABLET | Freq: Two times a day (BID) | ORAL | Status: DC

## 2015-02-24 MED ORDER — HYDROCOD POLST-CPM POLST ER 10-8 MG/5ML PO SUER: 5.0000 mL | Freq: Two times a day (BID) | ORAL | Status: DC | PRN

## 2015-02-24 NOTE — Progress Notes (Signed)
   Subjective:    Patient ID: Emily Avery, female    DOB: 05-20-1960, 55 y.o.   MRN: [id number]  HPI Here for several things. First she has had chest congestion and coughing up green sputum for the past 2 weeks. Using Alka Seltzer Cold Plus. She asks that we transfer all her meds to a new pharmacy. Her depression has been a little worse lately and she has had more mood swings. She asks if her meds can be increased.    Review of Systems  Constitutional: Positive for fever.  HENT: Positive for congestion and postnasal drip. Negative for sore throat.   Eyes: Negative.   Respiratory: Positive for cough and chest tightness.   Cardiovascular: Negative.   Psychiatric/Behavioral: Positive for dysphoric mood.       Objective:   Physical Exam  Constitutional: She is oriented to person, place, and time. She appears well-developed and well-nourished.  HENT:  Right Ear: External ear normal.  Left Ear: External ear normal.  Nose: Nose normal.  Mouth/Throat: Oropharynx is clear and moist.  Eyes: Conjunctivae are normal.  Neck: No thyromegaly present.  Cardiovascular: Normal rate, regular rhythm, normal heart sounds and intact distal pulses.   Pulmonary/Chest: Effort normal and breath sounds normal.  Lymphadenopathy:    She has no cervical adenopathy.  Neurological: She is alert and oriented to person, place, and time.  Psychiatric: She has a normal mood and affect. Her behavior is normal. Thought content normal.          Assessment & Plan:  Treat the bronchitis with a Zpack. Increase the Lamictal to help the depression. Her HTN is stable. Her ADHD is stable.

## 2015-02-24 NOTE — Progress Notes (Signed)
Pre visit review using our clinic review tool, if applicable. No additional management support is needed unless otherwise documented below in the visit note. 

## 2015-08-21 ENCOUNTER — Other Ambulatory Visit: Payer: Self-pay | Admitting: Family Medicine

## 2015-08-23 NOTE — Telephone Encounter (Signed)
Call in #180 with one rf  

## 2015-08-30 MED FILL — OXYBUTYNIN 5 MG TABLET: 5 | 90 days supply | Qty: 270 | Fill #2

## 2015-11-08 ENCOUNTER — Other Ambulatory Visit: Payer: Self-pay | Admitting: Family Medicine

## 2015-11-09 ENCOUNTER — Other Ambulatory Visit: Payer: Self-pay

## 2015-11-09 MED ORDER — METHYLPHENIDATE HCL ER (LA) 20 MG PO CP24: 20.0000 mg | ORAL_CAPSULE | ORAL | 0 refills | Status: DC

## 2015-11-09 NOTE — Telephone Encounter (Signed)
Script is ready for pick up here at front office and I spoke with pt.  

## 2015-11-09 NOTE — Telephone Encounter (Signed)
done

## 2015-11-09 NOTE — Telephone Encounter (Signed)
Last refill, 02/24/25, last OV 02/24/15. Ok to refill?

## 2016-02-12 ENCOUNTER — Other Ambulatory Visit: Payer: Self-pay | Admitting: Family Medicine

## 2016-02-16 ENCOUNTER — Other Ambulatory Visit: Payer: Self-pay | Admitting: Family Medicine

## 2016-02-16 NOTE — Telephone Encounter (Signed)
Call in a 30 day supply for each of these with no refills. She needs an OV soon

## 2016-03-04 DIAGNOSIS — Z0000 Encounter for general adult medical examination without abnormal findings: Secondary | ICD-10-CM | POA: Diagnosis not present

## 2016-03-04 DIAGNOSIS — E785 Hyperlipidemia, unspecified: Secondary | ICD-10-CM | POA: Diagnosis not present

## 2016-03-04 DIAGNOSIS — I10 Essential (primary) hypertension: Secondary | ICD-10-CM | POA: Diagnosis not present

## 2016-03-04 DIAGNOSIS — F418 Other specified anxiety disorders: Secondary | ICD-10-CM | POA: Diagnosis not present

## 2016-03-10 ENCOUNTER — Encounter: Payer: BLUE CROSS/BLUE SHIELD | Admitting: Family Medicine

## 2016-03-28 ENCOUNTER — Encounter: Payer: Self-pay | Admitting: Family Medicine

## 2016-03-28 ENCOUNTER — Ambulatory Visit (INDEPENDENT_AMBULATORY_CARE_PROVIDER_SITE_OTHER): Payer: BLUE CROSS/BLUE SHIELD | Admitting: Family Medicine

## 2016-03-28 ENCOUNTER — Encounter: Payer: BLUE CROSS/BLUE SHIELD | Admitting: Family Medicine

## 2016-03-28 VITALS — BP 130/86 | HR 85 | Temp 98.4°F | Ht 62.75 in | Wt 186.0 lb

## 2016-03-28 DIAGNOSIS — K219 Gastro-esophageal reflux disease without esophagitis: Secondary | ICD-10-CM | POA: Diagnosis not present

## 2016-03-28 DIAGNOSIS — E782 Mixed hyperlipidemia: Secondary | ICD-10-CM | POA: Diagnosis not present

## 2016-03-28 DIAGNOSIS — R319 Hematuria, unspecified: Secondary | ICD-10-CM | POA: Diagnosis not present

## 2016-03-28 DIAGNOSIS — F900 Attention-deficit hyperactivity disorder, predominantly inattentive type: Secondary | ICD-10-CM

## 2016-03-28 DIAGNOSIS — F324 Major depressive disorder, single episode, in partial remission: Secondary | ICD-10-CM | POA: Diagnosis not present

## 2016-03-28 DIAGNOSIS — I10 Essential (primary) hypertension: Secondary | ICD-10-CM | POA: Diagnosis not present

## 2016-03-28 LAB — TSH: TSH: 0.99 u[IU]/mL (ref 0.35–4.50)

## 2016-03-28 LAB — POC URINALSYSI DIPSTICK (AUTOMATED)
Bilirubin, UA: NEGATIVE
Glucose, UA: NEGATIVE
Ketones, UA: NEGATIVE
Leukocytes, UA: NEGATIVE
Nitrite, UA: NEGATIVE
Protein, UA: NEGATIVE
Spec Grav, UA: 1.02
Urobilinogen, UA: 0.2
pH, UA: 6

## 2016-03-28 LAB — HEPATIC FUNCTION PANEL
ALT: 14 U/L (ref 0–35)
AST: 13 U/L (ref 0–37)
Albumin: 4.3 g/dL (ref 3.5–5.2)
Alkaline Phosphatase: 114 U/L (ref 39–117)
Bilirubin, Direct: 0.1 mg/dL (ref 0.0–0.3)
Total Bilirubin: 0.3 mg/dL (ref 0.2–1.2)
Total Protein: 6.7 g/dL (ref 6.0–8.3)

## 2016-03-28 LAB — CBC WITH DIFFERENTIAL/PLATELET
Basophils Absolute: 0.1 10*3/uL (ref 0.0–0.1)
Basophils Relative: 0.6 % (ref 0.0–3.0)
Eosinophils Absolute: 0.1 10*3/uL (ref 0.0–0.7)
Eosinophils Relative: 0.9 % (ref 0.0–5.0)
HCT: 42.6 % (ref 36.0–46.0)
Hemoglobin: 14.6 g/dL (ref 12.0–15.0)
Lymphocytes Relative: 41.3 % (ref 12.0–46.0)
Lymphs Abs: 4 10*3/uL (ref 0.7–4.0)
MCHC: 34.3 g/dL (ref 30.0–36.0)
MCV: 93.6 fl (ref 78.0–100.0)
Monocytes Absolute: 0.5 10*3/uL (ref 0.1–1.0)
Monocytes Relative: 5.3 % (ref 3.0–12.0)
Neutro Abs: 5 10*3/uL (ref 1.4–7.7)
Neutrophils Relative %: 51.9 % (ref 43.0–77.0)
Platelets: 372 10*3/uL (ref 150.0–400.0)
RBC: 4.55 Mil/uL (ref 3.87–5.11)
RDW: 12.8 % (ref 11.5–15.5)
WBC: 9.7 10*3/uL (ref 4.0–10.5)

## 2016-03-28 LAB — BASIC METABOLIC PANEL
BUN: 14 mg/dL (ref 6–23)
CO2: 31 mEq/L (ref 19–32)
Calcium: 9.3 mg/dL (ref 8.4–10.5)
Chloride: 105 mEq/L (ref 96–112)
Creatinine, Ser: 0.62 mg/dL (ref 0.40–1.20)
GFR: 106.69 mL/min (ref >60.00)
Glucose, Bld: 82 mg/dL (ref 70–99)
Potassium: 4.2 mEq/L (ref 3.5–5.1)
Sodium: 140 mEq/L (ref 135–145)

## 2016-03-28 LAB — LIPID PANEL
Cholesterol: 143 mg/dL (ref 0–200)
HDL: 31.6 mg/dL — ABNORMAL LOW (ref >39.00)
LDL Cholesterol: 85 mg/dL (ref 0–99)
NonHDL: 111.16
Total CHOL/HDL Ratio: 5
Triglycerides: 130 mg/dL (ref 0.0–149.0)
VLDL: 26 mg/dL (ref 0.0–40.0)

## 2016-03-28 MED ORDER — TRAZODONE HCL 100 MG PO TABS: 100.0000 mg | ORAL_TABLET | Freq: Every day | ORAL | 3 refills | Status: DC

## 2016-03-28 MED ORDER — ALBUTEROL SULFATE HFA 108 (90 BASE) MCG/ACT IN AERS: 2.0000 | INHALATION_SPRAY | RESPIRATORY_TRACT | 5 refills | Status: DC | PRN

## 2016-03-28 MED ORDER — LISINOPRIL 10 MG PO TABS: 10.0000 mg | ORAL_TABLET | Freq: Every day | ORAL | 3 refills | Status: DC

## 2016-03-28 MED ORDER — SERTRALINE HCL 100 MG PO TABS: 100.0000 mg | ORAL_TABLET | Freq: Two times a day (BID) | ORAL | 3 refills | Status: DC

## 2016-03-28 MED ORDER — METHYLPHENIDATE HCL ER (LA) 20 MG PO CP24: 20.0000 mg | ORAL_CAPSULE | ORAL | 0 refills | Status: DC

## 2016-03-28 MED ORDER — ATORVASTATIN CALCIUM 20 MG PO TABS: 20.0000 mg | ORAL_TABLET | Freq: Every day | ORAL | 3 refills | Status: DC

## 2016-03-28 MED ORDER — LAMOTRIGINE 100 MG PO TABS: 100.0000 mg | ORAL_TABLET | Freq: Every day | ORAL | 3 refills | Status: DC

## 2016-03-28 MED ORDER — ALPRAZOLAM 2 MG PO TABS: 2.0000 mg | ORAL_TABLET | Freq: Two times a day (BID) | ORAL | 1 refills | Status: DC | PRN

## 2016-03-28 NOTE — Progress Notes (Signed)
Pre visit review using our clinic review tool, if applicable. No additional management support is needed unless otherwise documented below in the visit note. 

## 2016-03-28 NOTE — Progress Notes (Signed)
   Subjective:    Patient ID: Emily Avery, female    DOB: 10-28-1960, 56 y.o.   MRN: [id number]  HPI Here to follow up on issues. 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 Her sleep and appetite are good. Her BP is stable. She asks about something for occasional heartburn.    Review of Systems  Constitutional: Negative.   Respiratory: Negative.   Cardiovascular: Negative.   Gastrointestinal: Negative.   Neurological: Negative.   Psychiatric/Behavioral: Positive for dysphoric mood. Negative for agitation, behavioral problems, confusion, decreased concentration, hallucinations, self-injury, sleep disturbance and suicidal ideas. The patient is nervous/anxious. The patient is not hyperactive.        Objective:   Physical Exam  Constitutional: She is oriented to person, place, and time. She appears well-developed and well-nourished.  Cardiovascular: Normal rate, regular rhythm, normal heart sounds and intact distal pulses.   Pulmonary/Chest: Effort normal and breath sounds normal.  Abdominal: Soft. Bowel sounds are normal. She exhibits no distension and no mass. There is no tenderness. There is no rebound and no guarding.  Neurological: She is alert and oriented to person, place, and time.  Psychiatric: She has a normal mood and affect. Her behavior is normal. Thought content normal.          Assessment & Plan:  Her ADHD is stable. Her depression is stable but we discussed getting exercise to help reduce stress. Get fasting labs today to check her lipids among other things. She will try Prilosec OTC for the GERD. Her HTN is stable.  Alysia Penna, MD

## 2016-03-28 NOTE — Patient Instructions (Signed)
WE NOW OFFER   [facility] FAST TRACK!!!  SAME DAY Appointments for ACUTE CARE  Such as: Sprains, Injuries, cuts, abrasions, rashes, muscle pain, joint pain, back pain Colds, flu, sore throats, headache, allergies, cough, fever  Ear pain, sinus and eye infections Abdominal pain, nausea, vomiting, diarrhea, upset stomach Animal/insect bites  3 Easy Ways to Schedule: Walk-In Scheduling Call in scheduling Mychart Sign-up: [email or web address]         

## 2016-03-30 LAB — URINE CULTURE

## 2016-05-11 ENCOUNTER — Encounter: Payer: Self-pay | Admitting: Family Medicine

## 2016-05-11 ENCOUNTER — Telehealth: Payer: Self-pay | Admitting: Family Medicine

## 2016-05-11 ENCOUNTER — Ambulatory Visit (INDEPENDENT_AMBULATORY_CARE_PROVIDER_SITE_OTHER): Payer: BLUE CROSS/BLUE SHIELD | Admitting: Family Medicine

## 2016-05-11 VITALS — BP 100/60 | HR 95 | Temp 98.3°F | Ht 62.75 in | Wt 187.8 lb

## 2016-05-11 DIAGNOSIS — J209 Acute bronchitis, unspecified: Secondary | ICD-10-CM | POA: Diagnosis not present

## 2016-05-11 DIAGNOSIS — Z720 Tobacco use: Secondary | ICD-10-CM

## 2016-05-11 MED ORDER — BENZONATATE 100 MG PO CAPS: 100.0000 mg | ORAL_CAPSULE | Freq: Two times a day (BID) | ORAL | 0 refills | Status: DC | PRN

## 2016-05-11 MED ORDER — PREDNISONE 20 MG PO TABS: 40.0000 mg | ORAL_TABLET | Freq: Every day | ORAL | 0 refills | Status: DC

## 2016-05-11 NOTE — Telephone Encounter (Signed)
Pt was last seen in march 2018. Pt would like tussionex call into [facility]. Pt has a cough

## 2016-05-11 NOTE — Telephone Encounter (Signed)
lmom for pt to call back

## 2016-05-11 NOTE — Patient Instructions (Signed)
Please take the presdnisone as instructed.  Albuterol as needed.  Tessalon for cough as needed.  Please do not smoke - use this as an opportunity to quit all together if you can.  I hope you are feeling better soon! Seek care immediately if worsening, new concerns or you are not improving with treatment.   INSTRUCTIONS FOR UPPER RESPIRATORY INFECTION:  -plenty of rest and fluids  -nasal saline wash 2-3 times daily (use prepackaged nasal saline or bottled/distilled water if making your own)   -can use AFRIN nasal spray for drainage and nasal congestion - but do NOT use longer then 3-4 days  -can use tylenol (in no history of liver disease) or ibuprofen (if no history of kidney disease, bowel bleeding or significant heart disease) as directed for aches and sorethroat  -in the winter time, using a humidifier at night is helpful (please follow cleaning instructions)  -if you are taking a cough medication - use only as directed, may also try a teaspoon of honey to coat the throat and throat lozenges. Tessalon sent to the pharmacy.  -for sore throat, salt water gargles can help  -follow up if you have fevers, facial pain, tooth pain, difficulty breathing or are worsening or symptoms persist longer then expected  Upper Respiratory Infection, Adult An upper respiratory infection (URI) is also known as the common cold. It is often caused by a type of germ (virus). Colds are easily spread (contagious). You can pass it to others by kissing, coughing, sneezing, or drinking out of the same glass. Usually, you get better in 1 to 3  weeks.  However, the cough can last for even longer. HOME CARE   Only take medicine as told by your doctor. Follow instructions provided above.  Drink enough water and fluids to keep your pee (urine) clear or pale yellow.  Get plenty of rest.  Return to work when your temperature is < 100 for 24 hours or as told by your doctor. You may use a face mask and wash  your hands to stop your cold from spreading. GET HELP RIGHT AWAY IF:   After the first few days, you feel you are getting worse.  You have questions about your medicine.  You have chills, shortness of breath, or red spit (mucus).  You have pain in the face for more then 1-2 days, especially when you bend forward.  You have a fever, puffy (swollen) neck, pain when you swallow, or white spots in the back of your throat.  You have a bad headache, ear pain, sinus pain, or chest pain.  You have a high-pitched whistling sound when you breathe in and out (wheezing).  You cough up blood.  You have sore muscles or a stiff neck. MAKE SURE YOU:   Understand these instructions.  Will watch your condition.  Will get help right away if you are not doing well or get worse. Document Released: 06/28/2007 Document Revised: 04/03/2011 Document Reviewed: 04/16/2013 [facility] Patient Information 2015 [place], [place]. This information is not intended to replace advice given to you by your health care provider. Make sure you discuss any questions you have with your health care provider.

## 2016-05-11 NOTE — Telephone Encounter (Signed)
Pt scheduled  

## 2016-05-11 NOTE — Progress Notes (Signed)
HPI:  Acute visit for Cough and congestion -smoker, hx alb use with resp infections and allergies -these symptoms started: 2-3 days ago -symptoms:nasal congestion, pnd, cough, wheezy feeling - using her alb a few times per day -denies:fever, SOB, NVD, tooth pain, rash, thick mucus -has tried: alb -sick contacts/travel/risks: no reported flu, strep or tick exposure  ROS: See pertinent positives and negatives per HPI.  Past Medical History:  Diagnosis Date  . Depression   . Diverticulitis of colon   . GERD (gastroesophageal reflux disease)   . Hyperlipidemia   . Hypertension   . Kidney stones   . Osteoporosis   . Overactive bladder     Past Surgical History:  Procedure Laterality Date  . COLONOSCOPY  06-25-09   per Dr. Fuller Plan, benign polyps, repeat in 5 yrs   . LUMBAR DISC SURGERY  11-20-13   per Dr. Arnoldo Morale, at L5-S1     Family History  Problem Relation Age of Onset  . Alcohol abuse    . Arthritis    . Diabetes    . Hypertension    . Heart disease    . Colon cancer      Social History   Social History  . Marital status: Legally Separated    Spouse name: N/A  . Number of children: N/A  . Years of education: N/A   Social History Main Topics  . Smoking status: Current Every Day Smoker    Years: 35.00    Types: Cigarettes  . Smokeless tobacco: Never Used     Comment: 1 pack per day  . Alcohol use 0.0 oz/week     Comment: rare  . Drug use: No  . Sexual activity: Not Asked   Other Topics Concern  . None   Social History Narrative  . None     Current Outpatient Prescriptions:  .  albuterol (PROVENTIL HFA;VENTOLIN HFA) 108 (90 Base) MCG/ACT inhaler, Inhale 2 puffs into the lungs every 4 (four) hours as needed for wheezing or shortness of breath., Disp: 1 Inhaler, Rfl: 5 .  alprazolam (XANAX) 2 MG tablet, Take 1 tablet (2 mg total) by mouth 2 (two) times daily as needed., Disp: 180 tablet, Rfl: 1 .  atorvastatin (LIPITOR) 20 MG tablet, Take 1 tablet (20  mg total) by mouth daily., Disp: 90 tablet, Rfl: 3 .  cyclobenzaprine (FLEXERIL) 10 MG tablet, TAKE ONE TABLET BY MOUTH THREE TIMES DAILY AS NEEDED FOR MUSCLE SPASMS, Disp: 90 tablet, Rfl: 5 .  diclofenac (VOLTAREN) 75 MG EC tablet, TAKE 1 TABLET BY MOUTH TWICE DAILY, Disp: 180 tablet, Rfl: 0 .  lamoTRIgine (LAMICTAL) 100 MG tablet, Take 1 tablet (100 mg total) by mouth daily., Disp: 90 tablet, Rfl: 3 .  lisinopril (PRINIVIL,ZESTRIL) 10 MG tablet, Take 1 tablet (10 mg total) by mouth daily., Disp: 90 tablet, Rfl: 3 .  methylphenidate (RITALIN LA) 20 MG 24 hr capsule, Take 1 capsule (20 mg total) by mouth every morning., Disp: 30 capsule, Rfl: 0 .  oxybutynin (DITROPAN) 5 MG tablet, Take 5 mg by mouth 3 (three) times daily., Disp: , Rfl:  .  sertraline (ZOLOFT) 100 MG tablet, Take 1 tablet (100 mg total) by mouth 2 (two) times daily., Disp: 180 tablet, Rfl: 3 .  traZODone (DESYREL) 100 MG tablet, Take 1 tablet (100 mg total) by mouth at bedtime., Disp: 90 tablet, Rfl: 3 .  benzonatate (TESSALON) 100 MG capsule, Take 1 capsule (100 mg total) by mouth 2 (two) times daily as needed  for cough., Disp: 20 capsule, Rfl: 0 .  predniSONE (DELTASONE) 20 MG tablet, Take 2 tablets (40 mg total) by mouth daily with breakfast., Disp: 8 tablet, Rfl: 0  EXAM:  Vitals:   05/11/16 1551  BP: 100/60  Pulse: 95  Temp: 98.3 F (36.8 C)    Body mass index is 33.53 kg/m.  GENERAL: vitals reviewed and listed above, alert, oriented, appears well hydrated and in no acute distress  HEENT: atraumatic, conjunttiva clear, no obvious abnormalities on inspection of external nose and ears, normal appearance of ear canals and TMs, clear nasal congestion, mild post oropharyngeal erythema with PND, no tonsillar edema or exudate, no sinus TTP  NECK: no obvious masses on inspection  LUNGS: clear to auscultation bilaterally, no wheezes, rales or rhonchi, good air movement  CV: HRRR, no peripheral edema  MS: moves all  extremities without noticeable abnormality  PSYCH: pleasant and cooperative, no obvious depression or anxiety  ASSESSMENT AND PLAN:  Discussed the following assessment and plan:  Acute bronchitis, unspecified organism  Tobacco use  -given HPI and exam findings today, a serious infection or illness is unlikely. We discussed potential etiologies, with VURI with acute bronchitis, ? Underlying COPD or asthma. Opted to treat with prednisone, prn alb, tessalon. Advised to quit smoking. We discussed treatment side effects, likely course, antibiotic misuse, transmission, and signs of developing a serious illness. -of course, we advised to return or notify a doctor immediately if symptoms worsen or persist or new concerns arise.    Patient Instructions  Please take the presdnisone as instructed.  Albuterol as needed.  Tessalon for cough as needed.  Please do not smoke - use this as an opportunity to quit all together if you can.  I hope you are feeling better soon! Seek care immediately if worsening, new concerns or you are not improving with treatment.   INSTRUCTIONS FOR UPPER RESPIRATORY INFECTION:  -plenty of rest and fluids  -nasal saline wash 2-3 times daily (use prepackaged nasal saline or bottled/distilled water if making your own)   -can use AFRIN nasal spray for drainage and nasal congestion - but do NOT use longer then 3-4 days  -can use tylenol (in no history of liver disease) or ibuprofen (if no history of kidney disease, bowel bleeding or significant heart disease) as directed for aches and sorethroat  -in the winter time, using a humidifier at night is helpful (please follow cleaning instructions)  -if you are taking a cough medication - use only as directed, may also try a teaspoon of honey to coat the throat and throat lozenges. Tessalon sent to the pharmacy.  -for sore throat, salt water gargles can help  -follow up if you have fevers, facial pain, tooth pain,  difficulty breathing or are worsening or symptoms persist longer then expected  Upper Respiratory Infection, Adult An upper respiratory infection (URI) is also known as the common cold. It is often caused by a type of germ (virus). Colds are easily spread (contagious). You can pass it to others by kissing, coughing, sneezing, or drinking out of the same glass. Usually, you get better in 1 to 3  weeks.  However, the cough can last for even longer. HOME CARE   Only take medicine as told by your doctor. Follow instructions provided above.  Drink enough water and fluids to keep your pee (urine) clear or pale yellow.  Get plenty of rest.  Return to work when your temperature is < 100 for 24 hours or as  told by your doctor. You may use a face mask and wash your hands to stop your cold from spreading. GET HELP RIGHT AWAY IF:   After the first few days, you feel you are getting worse.  You have questions about your medicine.  You have chills, shortness of breath, or red spit (mucus).  You have pain in the face for more then 1-2 days, especially when you bend forward.  You have a fever, puffy (swollen) neck, pain when you swallow, or white spots in the back of your throat.  You have a bad headache, ear pain, sinus pain, or chest pain.  You have a high-pitched whistling sound when you breathe in and out (wheezing).  You cough up blood.  You have sore muscles or a stiff neck. MAKE SURE YOU:   Understand these instructions.  Will watch your condition.  Will get help right away if you are not doing well or get worse. Document Released: 06/28/2007 Document Revised: 04/03/2011 Document Reviewed: 04/16/2013 [facility] Patient Information 2015 [place], [place]. This information is not intended to replace advice given to you by your health care provider. Make sure you discuss any questions you have with your health care provider.     Colin Benton R., DO

## 2016-05-11 NOTE — Telephone Encounter (Signed)
She needs an OV for this  

## 2016-05-11 NOTE — Progress Notes (Signed)
Pre visit review using our clinic review tool, if applicable. No additional management support is needed unless otherwise documented below in the visit note. 

## 2016-06-04 DIAGNOSIS — J019 Acute sinusitis, unspecified: Secondary | ICD-10-CM | POA: Diagnosis not present

## 2016-06-04 DIAGNOSIS — R05 Cough: Secondary | ICD-10-CM | POA: Diagnosis not present

## 2016-08-18 ENCOUNTER — Telehealth: Payer: Self-pay | Admitting: Family Medicine

## 2016-08-18 MED ORDER — METHYLPHENIDATE HCL ER (LA) 20 MG PO CP24: 20.0000 mg | ORAL_CAPSULE | ORAL | 0 refills | Status: DC

## 2016-08-18 MED ORDER — METHYLPHENIDATE HCL ER (LA) 20 MG PO CP24
20.0000 mg | ORAL_CAPSULE | ORAL | 0 refills | Status: DC
Start: 2016-08-18 — End: 2016-08-18

## 2016-08-18 NOTE — Telephone Encounter (Signed)
Pt needs 3 rxs for the months July, aug and sept for ritalin 20 mg

## 2016-08-18 NOTE — Telephone Encounter (Signed)
Script is ready for pick up here at front office and I tried to reach pt, no answer.

## 2016-08-18 NOTE — Telephone Encounter (Signed)
done

## 2016-09-07 ENCOUNTER — Other Ambulatory Visit: Payer: Self-pay | Admitting: Family Medicine

## 2016-09-07 NOTE — Telephone Encounter (Signed)
Call in Alprazolam #180 with one rf, Cyclobenzaprine # 90 with 5 rf, and Diclofenac #180 with one rf

## 2016-09-07 NOTE — Telephone Encounter (Signed)
Pt need new Rx's for alprazolam #90, cyclobenzaprine #90 and diclofenac #90  Pharm: [facility] on [address] out of all medications.  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

## 2016-09-08 MED ORDER — ALPRAZOLAM 2 MG PO TABS: 2.0000 mg | ORAL_TABLET | Freq: Two times a day (BID) | ORAL | 1 refills | Status: DC | PRN

## 2016-09-08 MED ORDER — DICLOFENAC SODIUM 75 MG PO TBEC: 75.0000 mg | DELAYED_RELEASE_TABLET | Freq: Two times a day (BID) | ORAL | 1 refills | Status: DC

## 2016-09-08 MED ORDER — CYCLOBENZAPRINE HCL 10 MG PO TABS: ORAL_TABLET | ORAL | 5 refills | Status: DC

## 2016-09-08 NOTE — Telephone Encounter (Signed)
Alprazolam called in and left on machine.  Cyclobezaprine and diclofenac sent in by e-scribe.

## 2016-10-12 ENCOUNTER — Encounter: Payer: Self-pay | Admitting: Family Medicine

## 2016-11-07 ENCOUNTER — Encounter: Payer: Self-pay | Admitting: Family Medicine

## 2016-11-07 ENCOUNTER — Ambulatory Visit (INDEPENDENT_AMBULATORY_CARE_PROVIDER_SITE_OTHER): Payer: BLUE CROSS/BLUE SHIELD | Admitting: Family Medicine

## 2016-11-07 VITALS — BP 104/60 | Temp 98.3°F | Ht 62.75 in | Wt 193.0 lb

## 2016-11-07 DIAGNOSIS — I10 Essential (primary) hypertension: Secondary | ICD-10-CM | POA: Diagnosis not present

## 2016-11-07 DIAGNOSIS — Z23 Encounter for immunization: Secondary | ICD-10-CM

## 2016-11-07 DIAGNOSIS — N3281 Overactive bladder: Secondary | ICD-10-CM | POA: Insufficient documentation

## 2016-11-07 DIAGNOSIS — M150 Primary generalized (osteo)arthritis: Secondary | ICD-10-CM | POA: Diagnosis not present

## 2016-11-07 DIAGNOSIS — M8949 Other hypertrophic osteoarthropathy, multiple sites: Secondary | ICD-10-CM

## 2016-11-07 DIAGNOSIS — S00412A Abrasion of left ear, initial encounter: Secondary | ICD-10-CM | POA: Diagnosis not present

## 2016-11-07 DIAGNOSIS — M159 Polyosteoarthritis, unspecified: Secondary | ICD-10-CM

## 2016-11-07 MED ORDER — OXYBUTYNIN CHLORIDE 5 MG PO TABS: 5.0000 mg | ORAL_TABLET | Freq: Three times a day (TID) | ORAL | 3 refills | Status: DC

## 2016-11-07 MED ORDER — MELOXICAM 15 MG PO TABS: 15.0000 mg | ORAL_TABLET | Freq: Every day | ORAL | 3 refills | Status: DC

## 2016-11-07 MED ORDER — ALBUTEROL SULFATE HFA 108 (90 BASE) MCG/ACT IN AERS: 2.0000 | INHALATION_SPRAY | RESPIRATORY_TRACT | 5 refills | Status: DC | PRN

## 2016-11-07 NOTE — Addendum Note (Signed)
Addended by: Aggie Hacker A on: 11/07/2016 09:36 AM   Modules accepted: Orders

## 2016-11-07 NOTE — Patient Instructions (Signed)
WE NOW OFFER   [facility] FAST TRACK!!!  SAME DAY Appointments for ACUTE CARE  Such as: Sprains, Injuries, cuts, abrasions, rashes, muscle pain, joint pain, back pain Colds, flu, sore throats, headache, allergies, cough, fever  Ear pain, sinus and eye infections Abdominal pain, nausea, vomiting, diarrhea, upset stomach Animal/insect bites  3 Easy Ways to Schedule: Walk-In Scheduling Call in scheduling Mychart Sign-up: [email or web address]         

## 2016-11-07 NOTE — Progress Notes (Signed)
   Subjective:    Patient ID: Emily Avery, female    DOB: [birth year], 56 y.o.   MRN: [id number]  HPI Here for several issues. First her arthritis has been acting up lately with stiffness and pain in the neck, shoulders, and hips. She has been taking Diclofenac for several years. Second she noticed some dried blood coming from the left ear 2 days ago. There is no pain. She has a habit of cleaning her ears with Q Tips every day after her shower. Third she needs a note for her employer to allow her to use the bathroom frequently due to her overactive bladder.    Review of Systems  Constitutional: Negative.   Respiratory: Negative.   Cardiovascular: Negative.   Gastrointestinal: Negative.   Genitourinary: Positive for frequency and urgency. Negative for dysuria.  Musculoskeletal: Positive for arthralgias and neck pain.  Neurological: Negative.        Objective:   Physical Exam  Constitutional: She is oriented to person, place, and time. She appears well-developed and well-nourished.  HENT:  Left ear canal has a small clot of blood present   Neck: No thyromegaly present.  Cardiovascular: Normal rate, regular rhythm, normal heart sounds and intact distal pulses.   Pulmonary/Chest: Effort normal and breath sounds normal. No respiratory distress. She has no wheezes. She has no rales.  Lymphadenopathy:    She has no cervical adenopathy.  Neurological: She is alert and oriented to person, place, and time.          Assessment & Plan:  For the osteoarthritis, she will switch to Meloxicam 15 mg daily. For the OAB, we refilled Oxybutynin and wrote a note for her employer to allow her to use the bathroom as needed. Her HTN is stable. Also she has scratched her ear canal by using Q Tips, so I advised her to stop using these.  Alysia Penna, MD

## 2017-01-18 ENCOUNTER — Telehealth: Payer: Self-pay | Admitting: Family Medicine

## 2017-01-18 NOTE — Telephone Encounter (Signed)
Copied from [facility] #[id number]. Topic: Quick Communication - See Telephone Encounter >> Jan 18, 2017  2:34 PM Burnis Medin, NT wrote: CRM for notification. See Telephone encounter for: Pt is calling to see if she can get a refill for cyclobenzaprine (FLEXERIL) 10 MG tablet. Pt uses [id number] - [place], [address]  01/18/17.

## 2017-01-18 NOTE — Telephone Encounter (Signed)
Copied from [facility] [id number]. Topic: Quick Communication - See Telephone Encounter >> Jan 18, 2017  2:30 PM Cleaster Corin, [place] wrote: CRM for notification. See Telephone encounter for:   01/18/17.pt. Needs a refill on med. Ritalin 20 mg pt. Can be reached at [phone]

## 2017-01-18 NOTE — Telephone Encounter (Signed)
Requesting refill on Flexeril.

## 2017-01-19 NOTE — Telephone Encounter (Signed)
Last OV 11/07/2016. Rx was last refilled 08/18/2016 disp 30 with no refills. Called pt and left a VM that refill request was received however, PCP is out of the office and will NOT be back until 01/26/2017. Asked pt to call back if they are unable to wait until Monday.

## 2017-01-19 NOTE — Telephone Encounter (Signed)
Last OV 11/07/2016. Rx was last refilled 09/08/2016 disp 90 with 5 refills. Called pt and left a VM that PCP is out of the office and will NOT be back until Monday. Advised pt to call back if they are unable to wait until Monday for refills.

## 2017-01-19 NOTE — Telephone Encounter (Signed)
Sent to PCP for approval.  

## 2017-01-22 MED ORDER — CYCLOBENZAPRINE HCL 10 MG PO TABS: ORAL_TABLET | ORAL | 5 refills | Status: DC

## 2017-01-22 NOTE — Telephone Encounter (Signed)
Call in #90 with 5 rf 

## 2017-01-25 ENCOUNTER — Telehealth: Payer: Self-pay

## 2017-01-25 MED ORDER — METHYLPHENIDATE HCL ER (LA) 20 MG PO CP24: 20.0000 mg | ORAL_CAPSULE | ORAL | 0 refills | Status: DC

## 2017-01-25 NOTE — Telephone Encounter (Signed)
Called pt and left a VM that Rx was sent to the pharmacy [facility]

## 2017-01-25 NOTE — Telephone Encounter (Signed)
Done

## 2017-01-25 NOTE — Telephone Encounter (Signed)
Pt is requesting for a refill on her ritalin LA 20 MG. Last OV 11/07/2016. Rx was last refilled 08/18/2016 disp 30 with no refills. Sent to PCP for approval. [facility] in [facility]'s chart

## 2017-03-07 ENCOUNTER — Other Ambulatory Visit: Payer: Self-pay | Admitting: Family Medicine

## 2017-03-07 NOTE — Telephone Encounter (Signed)
Last OV 11/07/2016   Last refilled 09/08/2016 disp 180 with 1 refill   Sent to PCP for approval

## 2017-03-07 NOTE — Telephone Encounter (Signed)
Copied from [facility]. Topic: Quick Communication - Rx Refill/Question >> Mar 07, 2017 12:53 PM Emily Avery wrote: Medication: alprazolam [name]) 2 MG tablet  Has the patient contacted their pharmacy? Yes.    (Agent: If no, request that the patient contact the pharmacy for the refill.)  Preferred Pharmacy (with phone number or street name): [facility] - [address], [address] [place] [address] Phone: [phone] Fax: [phone]  Agent: Please be advised that RX refills may take up to 3 business days. We ask that you follow-up with your pharmacy.

## 2017-03-08 NOTE — Telephone Encounter (Signed)
Call in #180 with one rf  

## 2017-03-09 ENCOUNTER — Other Ambulatory Visit: Payer: Self-pay | Admitting: Family Medicine

## 2017-03-12 NOTE — Telephone Encounter (Signed)
Call in #180 with one rf  

## 2017-03-12 NOTE — Telephone Encounter (Signed)
Last OV: 11/07/16 Last refilled: 09/08/16 #180 with 1 refill.  Ok to refill? Please advise.

## 2017-03-12 NOTE — Telephone Encounter (Signed)
Mediation was called in.

## 2017-03-13 NOTE — Telephone Encounter (Signed)
Rx has been called into pt's pharmacy.  

## 2017-05-01 ENCOUNTER — Encounter: Payer: BLUE CROSS/BLUE SHIELD | Admitting: Family Medicine

## 2017-05-07 ENCOUNTER — Encounter: Payer: BLUE CROSS/BLUE SHIELD | Admitting: Family Medicine

## 2017-05-09 ENCOUNTER — Ambulatory Visit (INDEPENDENT_AMBULATORY_CARE_PROVIDER_SITE_OTHER): Payer: BLUE CROSS/BLUE SHIELD | Admitting: Family Medicine

## 2017-05-09 ENCOUNTER — Encounter: Payer: Self-pay | Admitting: Gastroenterology

## 2017-05-09 ENCOUNTER — Encounter: Payer: Self-pay | Admitting: Family Medicine

## 2017-05-09 VITALS — BP 130/78 | HR 91 | Temp 98.2°F | Ht 61.25 in | Wt 192.8 lb

## 2017-05-09 DIAGNOSIS — Z0000 Encounter for general adult medical examination without abnormal findings: Secondary | ICD-10-CM | POA: Diagnosis not present

## 2017-05-09 LAB — POC URINALSYSI DIPSTICK (AUTOMATED)
Bilirubin, UA: NEGATIVE
Blood, UA: NEGATIVE
Glucose, UA: NEGATIVE
Ketones, UA: NEGATIVE
Leukocytes, UA: NEGATIVE
Nitrite, UA: NEGATIVE
Protein, UA: NEGATIVE
Spec Grav, UA: 1.02 (ref 1.010–1.025)
Urobilinogen, UA: 0.2 E.U./dL
pH, UA: 6 (ref 5.0–8.0)

## 2017-05-09 LAB — CBC WITH DIFFERENTIAL/PLATELET
Basophils Absolute: 0.1 10*3/uL (ref 0.0–0.1)
Basophils Relative: 0.6 % (ref 0.0–3.0)
Eosinophils Absolute: 0.1 10*3/uL (ref 0.0–0.7)
Eosinophils Relative: 1 % (ref 0.0–5.0)
HCT: 44.9 % (ref 36.0–46.0)
Hemoglobin: 15.2 g/dL — ABNORMAL HIGH (ref 12.0–15.0)
Lymphocytes Relative: 40.6 % (ref 12.0–46.0)
Lymphs Abs: 3.9 10*3/uL (ref 0.7–4.0)
MCHC: 33.9 g/dL (ref 30.0–36.0)
MCV: 92.2 fl (ref 78.0–100.0)
Monocytes Absolute: 0.5 10*3/uL (ref 0.1–1.0)
Monocytes Relative: 5.4 % (ref 3.0–12.0)
Neutro Abs: 5 10*3/uL (ref 1.4–7.7)
Neutrophils Relative %: 52.4 % (ref 43.0–77.0)
Platelets: 378 10*3/uL (ref 150.0–400.0)
RBC: 4.87 Mil/uL (ref 3.87–5.11)
RDW: 13 % (ref 11.5–15.5)
WBC: 9.5 10*3/uL (ref 4.0–10.5)

## 2017-05-09 LAB — HEPATIC FUNCTION PANEL
ALT: 15 U/L (ref 0–35)
AST: 13 U/L (ref 0–37)
Albumin: 4.3 g/dL (ref 3.5–5.2)
Alkaline Phosphatase: 106 U/L (ref 39–117)
Bilirubin, Direct: 0.1 mg/dL (ref 0.0–0.3)
Total Bilirubin: 0.4 mg/dL (ref 0.2–1.2)
Total Protein: 7.1 g/dL (ref 6.0–8.3)

## 2017-05-09 LAB — LIPID PANEL
Cholesterol: 157 mg/dL (ref 0–200)
HDL: 33.6 mg/dL — ABNORMAL LOW (ref >39.00)
LDL Cholesterol: 89 mg/dL (ref 0–99)
NonHDL: 123.5
Total CHOL/HDL Ratio: 5
Triglycerides: 175 mg/dL — ABNORMAL HIGH (ref 0.0–149.0)
VLDL: 35 mg/dL (ref 0.0–40.0)

## 2017-05-09 LAB — BASIC METABOLIC PANEL
BUN: 18 mg/dL (ref 6–23)
CO2: 29 mEq/L (ref 19–32)
Calcium: 9.6 mg/dL (ref 8.4–10.5)
Chloride: 100 mEq/L (ref 96–112)
Creatinine, Ser: 0.61 mg/dL (ref 0.40–1.20)
GFR: 108.26 mL/min (ref >60.00)
Glucose, Bld: 84 mg/dL (ref 70–99)
Potassium: 5 mEq/L (ref 3.5–5.1)
Sodium: 138 mEq/L (ref 135–145)

## 2017-05-09 LAB — TSH: TSH: 0.93 u[IU]/mL (ref 0.35–4.50)

## 2017-05-09 MED ORDER — ALBUTEROL SULFATE HFA 108 (90 BASE) MCG/ACT IN AERS: 2.0000 | INHALATION_SPRAY | RESPIRATORY_TRACT | 5 refills | Status: DC | PRN

## 2017-05-09 MED ORDER — MELOXICAM 15 MG PO TABS: 15.0000 mg | ORAL_TABLET | Freq: Every day | ORAL | 3 refills | Status: DC

## 2017-05-09 MED ORDER — LOSARTAN POTASSIUM 50 MG PO TABS: 50.0000 mg | ORAL_TABLET | Freq: Every day | ORAL | 3 refills | Status: DC

## 2017-05-09 MED ORDER — CYCLOBENZAPRINE HCL 10 MG PO TABS: ORAL_TABLET | ORAL | 5 refills | Status: DC

## 2017-05-09 MED ORDER — OXYBUTYNIN CHLORIDE 5 MG PO TABS: 10.0000 mg | ORAL_TABLET | Freq: Two times a day (BID) | ORAL | 3 refills | Status: DC

## 2017-05-09 MED ORDER — SERTRALINE HCL 100 MG PO TABS: 100.0000 mg | ORAL_TABLET | Freq: Two times a day (BID) | ORAL | 3 refills | Status: DC

## 2017-05-09 MED ORDER — METHYLPHENIDATE HCL ER (LA) 20 MG PO CP24: 20.0000 mg | ORAL_CAPSULE | ORAL | 0 refills | Status: DC

## 2017-05-09 MED ORDER — LAMOTRIGINE 100 MG PO TABS: 100.0000 mg | ORAL_TABLET | Freq: Every day | ORAL | 3 refills | Status: DC

## 2017-05-09 MED ORDER — TRAZODONE HCL 100 MG PO TABS: 100.0000 mg | ORAL_TABLET | Freq: Every day | ORAL | 3 refills | Status: DC

## 2017-05-09 MED ORDER — ATORVASTATIN CALCIUM 20 MG PO TABS: 20.0000 mg | ORAL_TABLET | Freq: Every day | ORAL | 3 refills | Status: DC

## 2017-05-09 NOTE — Progress Notes (Signed)
   Subjective:    Patient ID: Emily Avery, female    DOB: [birth year], 57 y.o.   MRN: [id number]  HPI Here for a well exam. She feels well except she mentions a dry tickling cough that originates in the throat which started 6 months ago. She has been on Lisinopril for years. She is past due for a colonoscopy. She has not seen her GYN or had a mammogram for 2-3 years.    Review of Systems  Constitutional: Negative.   HENT: Negative.   Eyes: Negative.   Respiratory: Positive for cough. Negative for apnea, choking, chest tightness, shortness of breath, wheezing and stridor.   Cardiovascular: Negative.   Gastrointestinal: Negative.   Genitourinary: Negative for decreased urine volume, difficulty urinating, dyspareunia, dysuria, enuresis, flank pain, frequency, hematuria, pelvic pain and urgency.  Musculoskeletal: Negative.   Skin: Negative.   Neurological: Negative.   Psychiatric/Behavioral: Negative.        Objective:   Physical Exam  Constitutional: She is oriented to person, place, and time. She appears well-developed and well-nourished. No distress.  HENT:  Head: Normocephalic and atraumatic.  Right Ear: External ear normal.  Left Ear: External ear normal.  Nose: Nose normal.  Mouth/Throat: Oropharynx is clear and moist. No oropharyngeal exudate.  Eyes: Pupils are equal, round, and reactive to light. Conjunctivae and EOM are normal. No scleral icterus.  Neck: Normal range of motion. Neck supple. No JVD present. No thyromegaly present.  Cardiovascular: Normal rate, regular rhythm, normal heart sounds and intact distal pulses. Exam reveals no gallop and no friction rub.  No murmur heard. Pulmonary/Chest: Effort normal and breath sounds normal. No respiratory distress. She has no wheezes. She has no rales. She exhibits no tenderness.  Abdominal: Soft. Bowel sounds are normal. She exhibits no distension and no mass. There is no tenderness. There is no rebound and no guarding.    Musculoskeletal: Normal range of motion. She exhibits no edema or tenderness.  Lymphadenopathy:    She has no cervical adenopathy.  Neurological: She is alert and oriented to person, place, and time. She has normal reflexes. No cranial nerve deficit. She exhibits normal muscle tone. Coordination normal.  Skin: Skin is warm and dry. No rash noted. No erythema.  Psychiatric: She has a normal mood and affect. Her behavior is normal. Judgment and thought content normal.          Assessment & Plan:  Well exam. We discussed diet and exercise. Get fasting labs. Her cough is likely a side effect of her Lisinopril. She will stop this and start on Losartan 50 mg daily. Alysia Penna, MD

## 2017-06-02 ENCOUNTER — Other Ambulatory Visit: Payer: Self-pay | Admitting: Family Medicine

## 2017-07-05 ENCOUNTER — Telehealth: Payer: Self-pay | Admitting: Gastroenterology

## 2017-07-05 ENCOUNTER — Ambulatory Visit (AMBULATORY_SURGERY_CENTER): Payer: Self-pay | Admitting: *Deleted

## 2017-07-05 ENCOUNTER — Other Ambulatory Visit: Payer: Self-pay | Admitting: *Deleted

## 2017-07-05 ENCOUNTER — Other Ambulatory Visit: Payer: Self-pay

## 2017-07-05 VITALS — Ht 61.0 in | Wt 195.6 lb

## 2017-07-05 DIAGNOSIS — Z86010 Personal history of colonic polyps: Secondary | ICD-10-CM

## 2017-07-05 MED ORDER — SUPREP BOWEL PREP KIT 17.5-3.13-1.6 GM/177ML PO SOLN: 1.0000 | Freq: Once | ORAL | 0 refills | Status: AC

## 2017-07-05 NOTE — Telephone Encounter (Signed)
Received fax from [facility] stating pt insurance was requiring prior auth for suprep. Pt had pv 6.13.19 and colon scheduled for 6.27.19

## 2017-07-05 NOTE — Telephone Encounter (Signed)
Returned call to [employer] on [address].  Informed them that we do not do prior authorization for Suprep.  Informed them that when patient comes in to pick up prescription to run it on her insurance card and also use the coupon that patient has for Suprep.  Pharmacy verbalized understanding.

## 2017-07-05 NOTE — Progress Notes (Signed)
Patient denies any allergies to egg or soy products. Patient denies complications with anesthesia/sedation.  Patient denies oxygen use at home and denies diet medications. Patient denied information on colonoscopy. 

## 2017-07-19 ENCOUNTER — Encounter: Payer: BLUE CROSS/BLUE SHIELD | Admitting: Gastroenterology

## 2017-08-24 ENCOUNTER — Telehealth: Payer: Self-pay | Admitting: Family Medicine

## 2017-08-24 NOTE — Telephone Encounter (Signed)
Copied from [place] [id number]. Topic: Quick Communication - Rx Refill/Question >> Aug 24, 2017  8:08 AM Burchel, Abbi R wrote: Medication: methylphenidate (RITALIN LA) 20 MG 24 hr capsule  Preferred Pharmacy:  [facility] [place], [place] [address] Phone: [phone] Fax: [phone]   Pt is requesting a 90 day supply.  Pt was advised that RX refills may take up to 3 business days. Pt is out of medication.

## 2017-08-24 NOTE — Telephone Encounter (Signed)
Last OV 05/09/2017   Last refilled  05/09/2017 disp 30 with no refills   Sent to PCP to advise

## 2017-08-24 NOTE — Telephone Encounter (Signed)
methylphenidate refill Last Refill: 05/09/17 # 30 Last OV: 05/09/17 PCP: Dr Alysia Penna Pharmacy: [facility] [address]

## 2017-08-28 MED ORDER — METHYLPHENIDATE HCL ER (LA) 20 MG PO CP24: 20.0000 mg | ORAL_CAPSULE | ORAL | 0 refills | Status: DC

## 2017-08-28 NOTE — Telephone Encounter (Signed)
Done

## 2017-08-28 NOTE — Addendum Note (Signed)
Addended by: Alysia Penna A on: 08/28/2017 07:36 AM   Modules accepted: Orders

## 2017-11-30 ENCOUNTER — Telehealth: Payer: Self-pay | Admitting: Family Medicine

## 2017-11-30 NOTE — Telephone Encounter (Signed)
Copied from [place] [id number]. Topic: Quick Communication - Rx Refill/Question >> Nov 30, 2017  1:31 PM Scherrie Gerlach wrote: Medication: alprazolam [name]) 2 MG tablet Pt [facility] last Friday, 11/01.  But  I do not see anything from [facility]. Pt is now almost out.  [id number] - [place], [place] [phone] (Phone) [phone] (Fax)

## 2017-11-30 NOTE — Telephone Encounter (Signed)
Dr. Fry please advise on refill. 

## 2017-12-03 MED ORDER — ALPRAZOLAM 2 MG PO TABS: 2.0000 mg | ORAL_TABLET | Freq: Two times a day (BID) | ORAL | 1 refills | Status: DC | PRN

## 2017-12-03 NOTE — Telephone Encounter (Signed)
Refills called to the pharmacy per pts request.

## 2017-12-03 NOTE — Telephone Encounter (Signed)
Call in #180 with one rf  

## 2017-12-24 ENCOUNTER — Other Ambulatory Visit: Payer: Self-pay | Admitting: Family Medicine

## 2018-02-20 ENCOUNTER — Telehealth: Payer: Self-pay | Admitting: Family Medicine

## 2018-02-20 NOTE — Telephone Encounter (Signed)
Copied from [place] [id number]. Topic: Quick Communication - Rx Refill/Question >> Feb 20, 2018  4:32 PM Keene Breath wrote: Medication: methylphenidate (RITALIN LA) 20 MG 24 hr capsule (Expired)  Patient called to request a refill for the above medication  Preferred Pharmacy (with phone number or street name): [facility] [place], [place] [phone] (Phone) [phone] (Fax)

## 2018-02-21 MED ORDER — METHYLPHENIDATE HCL ER (LA) 20 MG PO CP24: 20.0000 mg | ORAL_CAPSULE | ORAL | 0 refills | Status: DC

## 2018-02-21 NOTE — Telephone Encounter (Signed)
Dr. Fry please advise. Thanks  

## 2018-02-21 NOTE — Telephone Encounter (Signed)
Done

## 2018-02-22 NOTE — Telephone Encounter (Signed)
I have called and spoke with pt and she is aware of rx that has been sent to the pharmacy.

## 2018-02-28 ENCOUNTER — Telehealth: Payer: Self-pay | Admitting: Family Medicine

## 2018-02-28 NOTE — Telephone Encounter (Signed)
Wrong office, Sending to [place].

## 2018-02-28 NOTE — Telephone Encounter (Signed)
Copied from [place]. Topic: Quick Communication - See Telephone Encounter >> Feb 28, 2018  3:14 PM Blase Mess A wrote: CRM for notification. See Telephone encounter for: 02/28/18.  Patient is going fax a wellness form wanted to let the clinical team aware Please advise

## 2018-03-04 NOTE — Telephone Encounter (Signed)
Forms have been received and placed in the red folder to be signed by Dr. Fry---Dr. Sarajane Jews please advise once this has been completed and I will fax these as requested.

## 2018-03-04 NOTE — Telephone Encounter (Signed)
Patient called in Friday and was trying to confirm receipt of fax for wellness paperwork that needed to be filled out.  She confirmed fax number and re faxed.  She asked for me to contact her when it was received. Per Marlowe Kays, the fax was not received.  Patient called back in before I could contact her back and the call was escalated to me because the patient was upset as she was being told there ws no Emily Avery.  I explained to the patient that I had not documented and all issues were resolved.  She emailed her paperwork to me and I forwarded to office for completion.

## 2018-03-05 NOTE — Telephone Encounter (Signed)
The form is ready  

## 2018-03-06 NOTE — Telephone Encounter (Signed)
Patient sent email to me to verify paperwork had been faxed to her employer.  I have replied to email and told her I will let her know the status tomorrow upon Leigh's return.Informed patient the fax lines were down as well.

## 2018-03-07 NOTE — Telephone Encounter (Signed)
Form has been faxed and received confirmation that this did go through.

## 2018-04-03 ENCOUNTER — Other Ambulatory Visit: Payer: Self-pay | Admitting: Obstetrics and Gynecology

## 2018-04-03 DIAGNOSIS — Z124 Encounter for screening for malignant neoplasm of cervix: Secondary | ICD-10-CM | POA: Diagnosis not present

## 2018-04-03 DIAGNOSIS — Z01419 Encounter for gynecological examination (general) (routine) without abnormal findings: Secondary | ICD-10-CM | POA: Diagnosis not present

## 2018-04-03 DIAGNOSIS — N6320 Unspecified lump in the left breast, unspecified quadrant: Secondary | ICD-10-CM

## 2018-04-03 DIAGNOSIS — N3281 Overactive bladder: Secondary | ICD-10-CM | POA: Diagnosis not present

## 2018-04-03 DIAGNOSIS — N63 Unspecified lump in unspecified breast: Secondary | ICD-10-CM | POA: Diagnosis not present

## 2018-04-03 DIAGNOSIS — Z6835 Body mass index (BMI) 35.0-35.9, adult: Secondary | ICD-10-CM | POA: Diagnosis not present

## 2018-04-05 ENCOUNTER — Other Ambulatory Visit: Payer: Self-pay | Admitting: Family Medicine

## 2018-04-05 NOTE — Telephone Encounter (Signed)
Dr. Sarajane Jews please advise on refills of meds.  Some are controlled.  Pt has a pending CPX on 05/13/18

## 2018-04-08 ENCOUNTER — Encounter: Payer: PRIVATE HEALTH INSURANCE | Admitting: Family Medicine

## 2018-04-09 DIAGNOSIS — K5904 Chronic idiopathic constipation: Secondary | ICD-10-CM | POA: Diagnosis not present

## 2018-04-09 DIAGNOSIS — K219 Gastro-esophageal reflux disease without esophagitis: Secondary | ICD-10-CM | POA: Diagnosis not present

## 2018-04-09 DIAGNOSIS — Z1211 Encounter for screening for malignant neoplasm of colon: Secondary | ICD-10-CM | POA: Diagnosis not present

## 2018-04-10 ENCOUNTER — Other Ambulatory Visit: Payer: Self-pay

## 2018-04-10 ENCOUNTER — Ambulatory Visit
Admission: RE | Admit: 2018-04-10 | Discharge: 2018-04-10 | Disposition: A | Discharge status: Home / Self Care | Payer: PRIVATE HEALTH INSURANCE | Source: Ambulatory Visit | Attending: Obstetrics and Gynecology | Admitting: Obstetrics and Gynecology

## 2018-04-10 ENCOUNTER — Ambulatory Visit: Payer: PRIVATE HEALTH INSURANCE

## 2018-04-10 DIAGNOSIS — N6320 Unspecified lump in the left breast, unspecified quadrant: Secondary | ICD-10-CM

## 2018-04-13 ENCOUNTER — Telehealth: Payer: Self-pay | Admitting: Family Medicine

## 2018-04-13 NOTE — Telephone Encounter (Signed)
Copied from [place] [id number]. Topic: Quick Communication - Rx Refill/Question >> Apr 13, 2018  4:34 PM Oneta Rack wrote: Medication: methylphenidate (RITALIN LA) 20 MG 24 hr capsule (pharmacy denies receiving Rx)  Preferred Pharmacy (with phone number or street name):  [id number] - [place], [place] [phone] (Phone) [phone] (Fax)  Agent: Please be advised that RX refills may take up to 3 business days. We ask that you follow-up with your pharmacy.

## 2018-04-15 NOTE — Telephone Encounter (Signed)
Called and spoke with pt and she is aware that refills are at the pharmacy until the end of April.

## 2018-04-15 NOTE — Telephone Encounter (Signed)
She is not due until 05-21-18

## 2018-04-15 NOTE — Telephone Encounter (Signed)
Dr. Fry please advise on refill. Thanks  

## 2018-04-30 ENCOUNTER — Other Ambulatory Visit: Payer: PRIVATE HEALTH INSURANCE

## 2018-05-01 ENCOUNTER — Other Ambulatory Visit: Payer: PRIVATE HEALTH INSURANCE

## 2018-05-13 ENCOUNTER — Encounter: Payer: PRIVATE HEALTH INSURANCE | Admitting: Family Medicine

## 2018-06-05 ENCOUNTER — Encounter: Payer: Self-pay | Admitting: *Deleted

## 2018-06-06 ENCOUNTER — Other Ambulatory Visit: Payer: Self-pay | Admitting: Family Medicine

## 2018-06-14 ENCOUNTER — Telehealth: Payer: Self-pay | Admitting: *Deleted

## 2018-06-14 ENCOUNTER — Telehealth: Payer: Self-pay | Admitting: Family Medicine

## 2018-06-14 NOTE — Telephone Encounter (Signed)
Pt was called to make an appointment and she stated that she needs a refill on all of her medications (alprazolam,atorvastatin, losartan, meloxicam ritalin and trazodone).

## 2018-06-14 NOTE — Telephone Encounter (Signed)
Copied from [place] [id number]. Topic: General - Other >> Jun 14, 2018  7:07 AM Keene Breath wrote: Reason for CRM: Patient called to make an appt. For her medication refills.  Please call patient to schedule an appt.  CB# [phone]

## 2018-06-19 NOTE — Telephone Encounter (Signed)
Dr. Fry please advise on refills. Thanks  

## 2018-06-20 NOTE — Telephone Encounter (Signed)
Pt was scheduled on 06/14/2018 to come in for a CPE on 07/09/2018 with Dr. Sarajane Jews

## 2018-06-20 NOTE — Telephone Encounter (Signed)
She will need either a cpx or a Doxy visit for this (can work in any time)

## 2018-07-09 ENCOUNTER — Ambulatory Visit (INDEPENDENT_AMBULATORY_CARE_PROVIDER_SITE_OTHER): Payer: BC Managed Care – PPO | Admitting: Family Medicine

## 2018-07-09 ENCOUNTER — Encounter: Payer: Self-pay | Admitting: Family Medicine

## 2018-07-09 ENCOUNTER — Other Ambulatory Visit: Payer: Self-pay

## 2018-07-09 VITALS — BP 140/84 | HR 63 | Temp 97.3°F | Ht 61.0 in | Wt 189.4 lb

## 2018-07-09 DIAGNOSIS — N3281 Overactive bladder: Secondary | ICD-10-CM | POA: Diagnosis not present

## 2018-07-09 DIAGNOSIS — Z0000 Encounter for general adult medical examination without abnormal findings: Secondary | ICD-10-CM

## 2018-07-09 LAB — LIPID PANEL
Cholesterol: 138 mg/dL (ref 0–200)
HDL: 27.8 mg/dL — ABNORMAL LOW (ref >39.00)
LDL Cholesterol: 75 mg/dL (ref 0–99)
NonHDL: 110.65
Total CHOL/HDL Ratio: 5
Triglycerides: 177 mg/dL — ABNORMAL HIGH (ref 0.0–149.0)
VLDL: 35.4 mg/dL (ref 0.0–40.0)

## 2018-07-09 LAB — CBC WITH DIFFERENTIAL/PLATELET
Basophils Absolute: 0.1 10*3/uL (ref 0.0–0.1)
Basophils Relative: 0.9 % (ref 0.0–3.0)
Eosinophils Absolute: 0.1 10*3/uL (ref 0.0–0.7)
Eosinophils Relative: 1 % (ref 0.0–5.0)
HCT: 44.2 % (ref 36.0–46.0)
Hemoglobin: 14.8 g/dL (ref 12.0–15.0)
Lymphocytes Relative: 39.9 % (ref 12.0–46.0)
Lymphs Abs: 4.3 10*3/uL — ABNORMAL HIGH (ref 0.7–4.0)
MCHC: 33.5 g/dL (ref 30.0–36.0)
MCV: 93.4 fl (ref 78.0–100.0)
Monocytes Absolute: 0.7 10*3/uL (ref 0.1–1.0)
Monocytes Relative: 6.8 % (ref 3.0–12.0)
Neutro Abs: 5.5 10*3/uL (ref 1.4–7.7)
Neutrophils Relative %: 51.4 % (ref 43.0–77.0)
Platelets: 344 10*3/uL (ref 150.0–400.0)
RBC: 4.73 Mil/uL (ref 3.87–5.11)
RDW: 13.3 % (ref 11.5–15.5)
WBC: 10.7 10*3/uL — ABNORMAL HIGH (ref 4.0–10.5)

## 2018-07-09 LAB — BASIC METABOLIC PANEL
BUN: 25 mg/dL — ABNORMAL HIGH (ref 6–23)
CO2: 29 mEq/L (ref 19–32)
Calcium: 9.3 mg/dL (ref 8.4–10.5)
Chloride: 102 mEq/L (ref 96–112)
Creatinine, Ser: 0.65 mg/dL (ref 0.40–1.20)
GFR: 94.25 mL/min (ref >60.00)
Glucose, Bld: 79 mg/dL (ref 70–99)
Potassium: 4.3 mEq/L (ref 3.5–5.1)
Sodium: 138 mEq/L (ref 135–145)

## 2018-07-09 LAB — POC URINALSYSI DIPSTICK (AUTOMATED)
Blood, UA: NEGATIVE
Glucose, UA: NEGATIVE
Ketones, UA: NEGATIVE
Leukocytes, UA: NEGATIVE
Nitrite, UA: NEGATIVE
Protein, UA: POSITIVE — AB
Spec Grav, UA: >=1.03 — AB (ref 1.010–1.025)
Urobilinogen, UA: 0.2 E.U./dL
pH, UA: 5.5 (ref 5.0–8.0)

## 2018-07-09 LAB — HEPATIC FUNCTION PANEL
ALT: 10 U/L (ref 0–35)
AST: 11 U/L (ref 0–37)
Albumin: 4.2 g/dL (ref 3.5–5.2)
Alkaline Phosphatase: 113 U/L (ref 39–117)
Bilirubin, Direct: 0.1 mg/dL (ref 0.0–0.3)
Total Bilirubin: 0.3 mg/dL (ref 0.2–1.2)
Total Protein: 6.6 g/dL (ref 6.0–8.3)

## 2018-07-09 LAB — TSH: TSH: 3.03 u[IU]/mL (ref 0.35–4.50)

## 2018-07-09 MED ORDER — ALPRAZOLAM 2 MG PO TABS: 2.0000 mg | ORAL_TABLET | Freq: Two times a day (BID) | ORAL | 1 refills | Status: DC | PRN

## 2018-07-09 MED ORDER — ALBUTEROL SULFATE HFA 108 (90 BASE) MCG/ACT IN AERS: 2.0000 | INHALATION_SPRAY | RESPIRATORY_TRACT | 5 refills | Status: DC | PRN

## 2018-07-09 MED ORDER — METHYLPHENIDATE HCL ER (LA) 20 MG PO CP24: 20.0000 mg | ORAL_CAPSULE | ORAL | 0 refills | Status: DC

## 2018-07-09 MED ORDER — LOSARTAN POTASSIUM 50 MG PO TABS: 50.0000 mg | ORAL_TABLET | Freq: Every day | ORAL | 3 refills | Status: DC

## 2018-07-09 MED ORDER — OXYBUTYNIN CHLORIDE 5 MG PO TABS: 10.0000 mg | ORAL_TABLET | Freq: Two times a day (BID) | ORAL | 3 refills | Status: DC

## 2018-07-09 MED ORDER — TRAZODONE HCL 100 MG PO TABS: 200.0000 mg | ORAL_TABLET | Freq: Every day | ORAL | 3 refills | Status: DC

## 2018-07-09 MED ORDER — SERTRALINE HCL 100 MG PO TABS: 100.0000 mg | ORAL_TABLET | Freq: Two times a day (BID) | ORAL | 3 refills | Status: DC

## 2018-07-09 MED ORDER — LAMOTRIGINE 100 MG PO TABS: 200.0000 mg | ORAL_TABLET | Freq: Every day | ORAL | 3 refills | Status: DC

## 2018-07-09 MED ORDER — MELOXICAM 15 MG PO TABS: ORAL_TABLET | ORAL | 3 refills | Status: DC

## 2018-07-09 MED ORDER — CYCLOBENZAPRINE HCL 10 MG PO TABS: ORAL_TABLET | ORAL | 5 refills | Status: DC
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
## 2018-07-11 ENCOUNTER — Encounter: Payer: Self-pay | Admitting: *Deleted

## 2018-07-15 ENCOUNTER — Inpatient Hospital Stay: Admission: RE | Admit: 2018-07-15 | Payer: PRIVATE HEALTH INSURANCE | Source: Ambulatory Visit

## 2018-07-22 ENCOUNTER — Other Ambulatory Visit: Payer: Self-pay

## 2018-07-22 ENCOUNTER — Ambulatory Visit
Admission: RE | Admit: 2018-07-22 | Discharge: 2018-07-22 | Disposition: A | Discharge status: Home / Self Care | Payer: BC Managed Care – PPO | Source: Ambulatory Visit | Attending: Obstetrics and Gynecology | Admitting: Obstetrics and Gynecology

## 2018-07-22 DIAGNOSIS — R928 Other abnormal and inconclusive findings on diagnostic imaging of breast: Secondary | ICD-10-CM | POA: Diagnosis not present

## 2018-07-22 DIAGNOSIS — N6459 Other signs and symptoms in breast: Secondary | ICD-10-CM | POA: Diagnosis not present

## 2018-07-22 DIAGNOSIS — N6320 Unspecified lump in the left breast, unspecified quadrant: Secondary | ICD-10-CM

## 2018-09-16 DIAGNOSIS — N3946 Mixed incontinence: Secondary | ICD-10-CM | POA: Diagnosis not present

## 2018-09-16 DIAGNOSIS — R3915 Urgency of urination: Secondary | ICD-10-CM | POA: Diagnosis not present

## 2018-09-16 DIAGNOSIS — N3281 Overactive bladder: Secondary | ICD-10-CM | POA: Diagnosis not present

## 2018-11-25 ENCOUNTER — Telehealth: Payer: Self-pay | Admitting: Family Medicine

## 2018-11-25 NOTE — Telephone Encounter (Signed)
Copied from [place] [id number]. Topic: General - Other >> Nov 25, 2018  8:43 AM Keene Breath wrote: Reason for CRM: Pharmacy called to inform the doctor or nurse that patient's medication, alprazolam [name]) 2 MG tablet, is on back order and they would like a script for an alternative.  Please call back to discuss at [phone].  You can leave a message on secure line.

## 2018-11-25 NOTE — Telephone Encounter (Signed)
Pt's pharmacy called back and stated to disregard previous message. [facility]

## 2018-11-26 NOTE — Telephone Encounter (Signed)
Noted  

## 2018-11-28 ENCOUNTER — Telehealth: Payer: Self-pay | Admitting: Family Medicine

## 2018-11-28 NOTE — Telephone Encounter (Signed)
Medication Refill - Medication: methylphenidate (RITALIN LA) 20 MG 24 hr capsule [id number]   Preferred Pharmacy (with phone number or street name):           Agent: Please be advised that RX refills may take up to 3 business days. We ask that you follow-up with your pharmacy.

## 2018-11-28 NOTE — Telephone Encounter (Signed)
Medication Refill - Medication: methylphenidate (RITALIN LA) 20 MG 24 hr capsule [id number]   Preferred Pharmacy (with phone number or street name[facility]  [id number] [place], [place]  [id number] [place] [phone]  Phone: [phone] Fax: [phone]     Agent: Please be advised that RX refills may take up to 3 business days. We ask that you follow-up with your pharmacy.

## 2018-11-28 NOTE — Telephone Encounter (Signed)
Okay for refill?  

## 2018-11-29 MED ORDER — METHYLPHENIDATE HCL ER (LA) 20 MG PO CP24: 20.0000 mg | ORAL_CAPSULE | ORAL | 0 refills | Status: DC

## 2018-11-29 NOTE — Telephone Encounter (Signed)
Done

## 2018-12-06 ENCOUNTER — Telehealth: Payer: Self-pay | Admitting: Family Medicine

## 2018-12-06 NOTE — Telephone Encounter (Signed)
Requested medication (s) are due for refill today: yes  Requested medication (s) are on the active medication list:yes  Future visit scheduled: no  Notes to clinic:  Medication was sent to wrong pharmacy.Please send to different pharmacy   Requested Prescriptions  Pending Prescriptions Disp Refills   methylphenidate (RITALIN LA) 20 MG 24 hr capsule 30 capsule 0    Sig: Take 1 capsule (20 mg total) by mouth every morning.     Not Delegated - Psychiatry:  Stimulants/ADHD Failed - 12/06/2018  2:54 PM      Failed - This refill cannot be delegated      Failed - Urine Drug Screen completed in last 360 days.      Failed - Valid encounter within last 3 months    Recent Outpatient Visits          5 months ago Preventative health care   [place] [facility] [place], Ishmael Holter, MD   1 year ago Preventative health care   [place] [facility] [place], Ishmael Holter, MD   2 years ago Primary osteoarthritis involving multiple joints   [place] at [facility], Ishmael Holter, MD   2 years ago Acute bronchitis, unspecified organism   [facility], Calvin, DO   2 years ago Attention deficit hyperactivity disorder (ADHD), predominantly inattentive type   [facility], Ishmael Holter, MD

## 2018-12-06 NOTE — Telephone Encounter (Signed)
Medication: methylphenidate (RITALIN LA) 20 MG 24 hr capsule [id number] -sent the wrong pharmacy- can this be sent to the pharmacy below.  Has the patient contacted their pharmacy? Yes  (Agent: If no, request that the patient contact the pharmacy for the refill.) (Agent: If yes, when and what did the pharmacy advise?)  Preferred Pharmacy (with phone number or [id number] [place], [place] [id number] (Phone) [phone] (Fax)  Agent: Please be advised that RX refills may take up to 3 business days. We ask that you follow-up with your pharmacy.

## 2018-12-06 NOTE — Telephone Encounter (Signed)
See rx refill request 

## 2019-02-05 ENCOUNTER — Other Ambulatory Visit: Payer: Self-pay | Admitting: Family Medicine

## 2019-02-07 ENCOUNTER — Telehealth: Payer: Self-pay | Admitting: *Deleted

## 2019-02-07 NOTE — Telephone Encounter (Signed)
Copied from [place] [id number]. Topic: General - Inquiry >> Feb 06, 2019  6:46 PM Emily Avery wrote: Reason for CRM: Patient is wanting a call back or a prescription filled from Dr Murrell Redden for possible bronchitis. Please advise

## 2019-02-07 NOTE — Telephone Encounter (Signed)
Patient is aware 

## 2019-02-07 NOTE — Telephone Encounter (Signed)
Please advise. Patient has been scheduled for a virtual appointment on Monday 02/10/2019. Patient would like to know if Dr. Sarajane Jews has any recommendations in the meantime.

## 2019-02-07 NOTE — Telephone Encounter (Signed)
Take Mucinex BID and drink fluids

## 2019-02-10 ENCOUNTER — Telehealth (INDEPENDENT_AMBULATORY_CARE_PROVIDER_SITE_OTHER): Payer: Self-pay | Admitting: Family Medicine

## 2019-02-10 ENCOUNTER — Other Ambulatory Visit: Payer: Self-pay

## 2019-02-10 DIAGNOSIS — J40 Bronchitis, not specified as acute or chronic: Secondary | ICD-10-CM

## 2019-02-10 MED ORDER — AZITHROMYCIN 250 MG PO TABS: ORAL_TABLET | ORAL | 0 refills | Status: DC

## 2019-02-10 MED ORDER — HYDROCOD POLST-CPM POLST ER 10-8 MG/5ML PO SUER: 5.0000 mL | Freq: Two times a day (BID) | ORAL | 0 refills | Status: DC | PRN

## 2019-02-10 NOTE — Progress Notes (Signed)
Virtual Visit via Telephone Note  I connected with the patient on 02/10/19 at 11:15 AM EST by telephone and verified that I am speaking with the correct person using two identifiers.   I discussed the limitations, risks, security and privacy concerns of performing an evaluation and management service by telephone and the availability of in person appointments. I also discussed with the patient that there may be a patient responsible charge related to this service. The patient expressed understanding and agreed to proceed.  Location patient: home Location provider: work or home office Participants present for the call: patient, provider Patient did not have a visit in the prior 7 days to address this/these issue(s).   History of Present Illness: Here for one week of chest tightness and a dry cough. No fever. The first day she had stuffy head and PND, but this resolved. No body aches or chest pain or NVD. Using Mucinex and Delsym.  Observations/Objective: Patient sounds cheerful and well on the phone. I do not appreciate any SOB. Speech and thought processing are grossly intact. Patient reported vitals:  Assessment and Plan: Bronchitis, treat with a Zpack. Use Tussionex for the cough. She can use her inhaler as well.  Emily Penna, MD   Follow Up Instructions:     [phone] 5-10 [phone] 11-20 [id number] 21-30 I did not refer this patient for an OV in the next 24 hours for this/these issue(s).  I discussed the assessment and treatment plan with the patient. The patient was provided an opportunity to ask questions and all were answered. The patient agreed with the plan and demonstrated an understanding of the instructions.   The patient was advised to call back or seek an in-person evaluation if the symptoms worsen or if the condition fails to improve as anticipated.  I provided 12 minutes of non-face-to-face time during this encounter.   Emily Penna, MD

## 2019-02-24 ENCOUNTER — Other Ambulatory Visit: Payer: Self-pay | Admitting: Family Medicine

## 2019-02-24 NOTE — Telephone Encounter (Signed)
Last filled 07/09/2018 Last OV 02/10/2019  Ok to fill?

## 2019-05-22 ENCOUNTER — Other Ambulatory Visit: Payer: Self-pay | Admitting: Family Medicine

## 2019-07-14 ENCOUNTER — Other Ambulatory Visit: Payer: Self-pay | Admitting: Family Medicine

## 2019-07-14 NOTE — Telephone Encounter (Signed)
Patient need to schedule an ov for more refills. 

## 2019-07-20 ENCOUNTER — Other Ambulatory Visit: Payer: Self-pay | Admitting: Family Medicine

## 2019-07-24 ENCOUNTER — Other Ambulatory Visit: Payer: Self-pay | Admitting: Family Medicine

## 2019-07-29 ENCOUNTER — Other Ambulatory Visit: Payer: Self-pay | Admitting: Family Medicine

## 2019-08-25 ENCOUNTER — Other Ambulatory Visit: Payer: Self-pay | Admitting: Family Medicine

## 2019-08-25 NOTE — Telephone Encounter (Signed)
Last filled 02/24/2019 Last OV 02/10/2019  Ok to fill?

## 2019-09-12 ENCOUNTER — Encounter (HOSPITAL_COMMUNITY): Payer: Self-pay

## 2019-09-12 ENCOUNTER — Emergency Department (HOSPITAL_COMMUNITY)
Admission: EM | Admit: 2019-09-12 | Discharge: 2019-09-13 | Disposition: A | Discharge status: Home / Self Care | Payer: BC Managed Care – PPO | Attending: Emergency Medicine | Admitting: Emergency Medicine

## 2019-09-12 ENCOUNTER — Telehealth: Payer: Self-pay | Admitting: Family Medicine

## 2019-09-12 ENCOUNTER — Other Ambulatory Visit: Payer: Self-pay

## 2019-09-12 DIAGNOSIS — M5441 Lumbago with sciatica, right side: Secondary | ICD-10-CM | POA: Diagnosis not present

## 2019-09-12 DIAGNOSIS — M545 Low back pain: Secondary | ICD-10-CM | POA: Diagnosis not present

## 2019-09-12 DIAGNOSIS — R202 Paresthesia of skin: Secondary | ICD-10-CM | POA: Diagnosis not present

## 2019-09-12 DIAGNOSIS — Z79899 Other long term (current) drug therapy: Secondary | ICD-10-CM | POA: Diagnosis not present

## 2019-09-12 DIAGNOSIS — I10 Essential (primary) hypertension: Secondary | ICD-10-CM | POA: Diagnosis not present

## 2019-09-12 DIAGNOSIS — F17210 Nicotine dependence, cigarettes, uncomplicated: Secondary | ICD-10-CM | POA: Diagnosis not present

## 2019-09-12 DIAGNOSIS — M549 Dorsalgia, unspecified: Secondary | ICD-10-CM | POA: Diagnosis not present

## 2019-09-12 MED ORDER — METHYLPHENIDATE HCL ER (LA) 20 MG PO CP24: 20.0000 mg | ORAL_CAPSULE | ORAL | 0 refills | Status: DC

## 2019-09-12 NOTE — ED Triage Notes (Signed)
Pt arrives POV for eval of R sided back pain x 1 month. Pt also reports that she feels it is affecting her bowel/bladder control. Pt reports urinary incontinence for 6-7 months, and fecal incontinence x 1 mos. Denies saddle paresthesias, endorses L foot numbness since 2015

## 2019-09-12 NOTE — Telephone Encounter (Signed)
Spoke with the patient. She is aware her medication has been sent in. Nothing further needed.

## 2019-09-12 NOTE — Telephone Encounter (Signed)
Last filled 01/29/2019 Last OV 02/10/2019  Ok to fill.

## 2019-09-12 NOTE — Telephone Encounter (Signed)
Pt is calling in stating that she needs a refill on methylphenidate (RITALIN LA) 20 MG  [facility] [address]

## 2019-09-12 NOTE — Telephone Encounter (Signed)
Done

## 2019-09-13 ENCOUNTER — Other Ambulatory Visit: Payer: Self-pay | Admitting: Family Medicine

## 2019-09-13 ENCOUNTER — Emergency Department (HOSPITAL_COMMUNITY): Payer: BC Managed Care – PPO

## 2019-09-13 ENCOUNTER — Other Ambulatory Visit: Payer: Self-pay

## 2019-09-13 DIAGNOSIS — M545 Low back pain: Secondary | ICD-10-CM | POA: Diagnosis not present

## 2019-09-13 LAB — CBC WITH DIFFERENTIAL/PLATELET
Abs Immature Granulocytes: 0.05 10*3/uL (ref 0.00–0.07)
Basophils Absolute: 0.1 10*3/uL (ref 0.0–0.1)
Basophils Relative: 1 %
Eosinophils Absolute: 0.1 10*3/uL (ref 0.0–0.5)
Eosinophils Relative: 1 %
HCT: 42.8 % (ref 36.0–46.0)
Hemoglobin: 14 g/dL (ref 12.0–15.0)
Immature Granulocytes: 1 %
Lymphocytes Relative: 39 %
Lymphs Abs: 4 10*3/uL (ref 0.7–4.0)
MCH: 30.4 pg (ref 26.0–34.0)
MCHC: 32.7 g/dL (ref 30.0–36.0)
MCV: 93 fL (ref 80.0–100.0)
Monocytes Absolute: 0.8 10*3/uL (ref 0.1–1.0)
Monocytes Relative: 8 %
Neutro Abs: 5.1 10*3/uL (ref 1.7–7.7)
Neutrophils Relative %: 50 %
Platelets: 326 10*3/uL (ref 150–400)
RBC: 4.6 MIL/uL (ref 3.87–5.11)
RDW: 12.9 % (ref 11.5–15.5)
WBC: 10.1 10*3/uL (ref 4.0–10.5)
nRBC: 0 % (ref 0.0–0.2)

## 2019-09-13 LAB — BASIC METABOLIC PANEL
Anion gap: 11 (ref 5–15)
BUN: 14 mg/dL (ref 6–20)
CO2: 26 mmol/L (ref 22–32)
Calcium: 9.5 mg/dL (ref 8.9–10.3)
Chloride: 101 mmol/L (ref 98–111)
Creatinine, Ser: 0.63 mg/dL (ref 0.44–1.00)
GFR calc Af Amer: >60 mL/min (ref >60)
GFR calc non Af Amer: >60 mL/min (ref >60)
Glucose, Bld: 85 mg/dL (ref 70–99)
Potassium: 3.9 mmol/L (ref 3.5–5.1)
Sodium: 138 mmol/L (ref 135–145)

## 2019-09-13 MED ORDER — DEXAMETHASONE SODIUM PHOSPHATE 10 MG/ML IJ SOLN
10.0000 mg | Freq: Once | INTRAMUSCULAR | Status: AC
  Administered 2019-09-13: 10 mg via INTRAMUSCULAR
  Filled 2019-09-13: qty 1

## 2019-09-13 MED ORDER — HYDROCODONE-ACETAMINOPHEN 5-325 MG PO TABS: 1.0000 | ORAL_TABLET | Freq: Four times a day (QID) | ORAL | 0 refills | Status: DC | PRN

## 2019-09-13 MED ORDER — OXYCODONE-ACETAMINOPHEN 5-325 MG PO TABS
2.0000 | ORAL_TABLET | Freq: Once | ORAL | Status: AC
  Administered 2019-09-13: 2 via ORAL
  Filled 2019-09-13: qty 2

## 2019-09-13 NOTE — ED Provider Notes (Signed)
[facility] Provider Note   CSN: [id number] Arrival date & time: 09/12/19  1901     History Chief Complaint  Patient presents with  . Back Pain    Emily Avery is a 59 y.o. female.  Patient is a 59 year old female who presents with back pain and fecal incontinence.  She has a history of chronic back pain.  She had back surgery in 2015.  She says that she always has some mild pain in her back but over the last month her pain has gotten worse.  She complains of pain in her low back.  It radiates to her right leg and down toward her right foot.  She denies any numbness in her legs other than she has some chronic numbness in her left toes from her prior back surgery.  She says that she chronically has a problem with bladder control.  She says that she has a sense that she has to urinate but cannot make it to the bathroom in time.  She is seeing a urologist twice for that and is currently on medication for the same.  She says that issue has been going on for a couple years.  She says over the last week she has had fecal incontinence.  She does report loose stools and says sometimes she can make it to the toilet and sometimes she cannot.  She cannot really tell me if she always has an urge to have a bowel movement.  She says it just comes out so fast she does not realize it.  She denies any recent injuries to her back.  No fevers.  No associated abdominal pain.  No urinary symptoms.        Past Medical History:  Diagnosis Date  . Anxiety   . Depression    Bipolar  . Diverticulitis of colon   . GERD (gastroesophageal reflux disease)    diet controlled - no meds  . HA (headache)   . History of kidney stones    passed stones - no surgery  . Hyperlipidemia   . Hypertension   . Osteoporosis   . Overactive bladder   . SOB (shortness of breath) on exertion    uses inhaler prn    Patient Active Problem List   Diagnosis Date Noted  . OAB  (overactive bladder) 11/07/2016  . Hyperlipemia, mixed 02/18/2013  . IBS (irritable bowel syndrome) 05/29/2012  . Attention deficit hyperactivity disorder (ADHD) 02/07/2010  . ABDOMINAL PAIN-MULTIPLE SITES 04/21/2009  . GERD 01/28/2009  . Osteoarthritis 01/28/2009  . ABDOMINAL PAIN, EPIGASTRIC 01/28/2009  . MENOPAUSAL SYNDROME 10/29/2007  . Depression 01/15/2007  . Essential hypertension 01/15/2007  . Osteoarthritis of spine 01/15/2007    Past Surgical History:  Procedure Laterality Date  . COLONOSCOPY  06-25-09   per Dr. Fuller Plan, benign polyps, repeat in 5 yrs   . HAND SURGERY     middle finger on right hand   . LUMBAR DISC SURGERY  11-20-13   per Dr. Arnoldo Morale, at L5-S1      OB History   No obstetric history on file.     Family History  Problem Relation Age of Onset  . Alcohol abuse Other   . Arthritis Other   . Diabetes Other   . Hypertension Other   . Heart disease Other   . Colon cancer Maternal Grandfather   . Rectal cancer Neg Hx   . Stomach cancer Neg Hx     Social History  Tobacco Use  . Smoking status: Current Every Day Smoker    Packs/day: 1.00    Years: 42.00    Pack years: 42.00    Types: Cigarettes  . Smokeless tobacco: Never Used  Vaping Use  . Vaping Use: Never used  Substance Use Topics  . Alcohol use: Yes    Alcohol/week: 0.0 standard drinks    Comment: rare  . Drug use: No    Home Medications Prior to Admission medications   Medication Sig Start Date End Date Taking? Authorizing Provider  albuterol (VENTOLIN HFA) 108 (90 Base) MCG/ACT inhaler Inhale 2 puffs into the lungs every 4 (four) hours as needed for wheezing or shortness of breath. 07/09/18   Laurey Morale, MD  alprazolam [name]) 2 MG tablet TAKE 1 TABLET BY MOUTH TWICE DAILY AS NEEDED FOR ANXIETY 08/27/19   Laurey Morale, MD  atorvastatin (LIPITOR) 20 MG tablet TAKE 1 TABLET EVERY DAY 05/22/19   Laurey Morale, MD  azithromycin (ZITHROMAX Z-PAK) 250 MG tablet As directed 02/10/19    Laurey Morale, MD  chlorpheniramine-HYDROcodone [facility] PENNKINETIC ER) 10-8 MG/5ML SUER Take 5 mLs by mouth every 12 (twelve) hours as needed for cough. 02/10/19   Laurey Morale, MD  cyclobenzaprine (FLEXERIL) 10 MG tablet TAKE ONE TABLET BY MOUTH THREE TIMES DAILY AS NEEDED FOR MUSCLE SPASMS 07/09/18   Laurey Morale, MD  HYDROcodone-acetaminophen (NORCO/VICODIN) 5-325 MG tablet Take 1-2 tablets by mouth every 6 (six) hours as needed. 09/13/19   Malvin Johns, MD  lamoTRIgine (LAMICTAL) 100 MG tablet TAKE 2 TABLETS EVERY DAY 07/21/19   Laurey Morale, MD  losartan (COZAAR) 50 MG tablet TAKE 1 TABLET BY MOUTH EVERY DAY 07/29/19   Laurey Morale, MD  meloxicam (MOBIC) 15 MG tablet TAKE 1 TABLET EVERY DAY 07/24/19   Laurey Morale, MD  methylphenidate (RITALIN LA) 20 MG 24 hr capsule Take 1 capsule (20 mg total) by mouth every morning. 11/12/19 12/12/19  Laurey Morale, MD  oxybutynin (DITROPAN) 5 MG tablet Take 2 tablets (10 mg total) by mouth 2 (two) times daily. 07/09/18   Laurey Morale, MD  sertraline (ZOLOFT) 100 MG tablet TAKE 1 TABLET BY MOUTH TWICE DAILY 02/05/19   Laurey Morale, MD  traZODone (DESYREL) 100 MG tablet Take 2 tablets (200 mg total) by mouth at bedtime. 07/09/18   Laurey Morale, MD    Allergies    Codeine and Lisinopril  Review of Systems   Review of Systems  Constitutional: Negative for chills, diaphoresis, fatigue and fever.  HENT: Negative for congestion, rhinorrhea and sneezing.   Eyes: Negative.   Respiratory: Negative for cough, chest tightness and shortness of breath.   Cardiovascular: Negative for chest pain and leg swelling.  Gastrointestinal: Negative for abdominal pain, blood in stool, diarrhea, nausea and vomiting.  Genitourinary: Negative for difficulty urinating, flank pain, frequency and hematuria.  Musculoskeletal: Positive for back pain. Negative for arthralgias.  Skin: Negative for rash.  Neurological: Positive for numbness. Negative for dizziness,  speech difficulty, weakness and headaches.    Physical Exam Updated Vital Signs BP 132/79 (BP Location: Right Arm)   Pulse 73   Temp 98.6 F (37 C) (Oral)   Resp 18   Ht 5\' 1"  (1.549 m)   Wt 88.5 kg   LMP 04/22/2012   SpO2 100%   BMI 36.84 kg/m   Physical Exam Constitutional:      Appearance: She is well-developed.  HENT:  Head: Normocephalic and atraumatic.  Eyes:     Pupils: Pupils are equal, round, and reactive to light.  Cardiovascular:     Rate and Rhythm: Normal rate and regular rhythm.     Heart sounds: Normal heart sounds.  Pulmonary:     Effort: Pulmonary effort is normal. No respiratory distress.     Breath sounds: Normal breath sounds. No wheezing or rales.  Chest:     Chest wall: No tenderness.  Abdominal:     General: Bowel sounds are normal.     Palpations: Abdomen is soft.     Tenderness: There is no abdominal tenderness. There is no guarding or rebound.  Musculoskeletal:        General: Normal range of motion.     Cervical back: Normal range of motion and neck supple.     Comments: Positive tenderness to the lower lumbar spine in the L4-L5 area, there is no pain over the sciatic nerve, no step-offs or deformities are noted.  Lymphadenopathy:     Cervical: No cervical adenopathy.  Skin:    General: Skin is warm and dry.     Findings: No rash.  Neurological:     Mental Status: She is alert and oriented to person, place, and time.     Comments: 5 out of 5 motor strength in her bilateral lower extremities, patellar reflexes symmetric bilaterally, pedal pulses are intact.  She has normal sensation to light touch in her feet bilaterally.  Negative straight leg raise bilaterally.  She has normal sensation to light touch around her perineum.  She does have some slightly diminished rectal tone     ED Results / Procedures / Treatments   Labs (all labs ordered are listed, but only abnormal results are displayed) Labs Reviewed  BASIC METABOLIC PANEL  CBC  WITH DIFFERENTIAL/PLATELET    EKG None  Radiology MR LUMBAR SPINE WO CONTRAST  Result Date: 09/13/2019 CLINICAL DATA:  Right side low back pain for 1 month. The patient reports urinary incontinence for 6-7 months and fecal incontinence for 1 month. History of prior lumbar surgery. EXAM: MRI LUMBAR SPINE WITHOUT CONTRAST TECHNIQUE: Multiplanar, multisequence MR imaging of the lumbar spine was performed. No intravenous contrast was administered. COMPARISON:  Report of MRI 10/09/2013 reviewed. Images are not available. FINDINGS: Segmentation:  Standard. Alignment:  Maintained. Vertebrae: No fracture, evidence of discitis, or bone lesion. Mild degenerative endplate signal change B1-Y7 noted. Conus medullaris and cauda equina: Conus extends to the L1-2 level. Conus and cauda equina appear normal. Paraspinal and other soft tissues: Negative. Disc levels: T11-12 and T12-L1 are imaged in the sagittal plane only. There is a very shallow central protrusion at T11-12 without stenosis. T12-L1 is negative. L1-2: Negative. L2-3: Shallow disc bulge and mild facet to moderate facet arthropathy. No stenosis. L3-4: Moderate facet degenerative change. There is a shallow disc bulge, ligamentum flavum thickening. Dorsal epidural fat is prominent. There is moderate compression of the thecal sac by disc and epidural fat. The foramina are open. L4-5: Advanced bilateral facet degenerative change. There is ligamentum flavum thickening and a right paracentral protrusion. Moderately severe central canal stenosis is present with marked narrowing in the subarticular recesses and encroachment on the L5 roots. Mild bilateral foraminal narrowing noted. Based on the prior report, degenerative disease has worsened since the 2015 exam. L5-S1: Status post left laminectomy. There is a disc bulge and endplate spur more prominent to the left. The central canal and subarticular recesses are open. Moderately severe to severe  foraminal narrowing is  worse on the left. IMPRESSION: Right paracentral disc protrusion and ligamentum flavum thickening cause moderately severe central canal stenosis at L4-5 and marked narrowing in the subarticular recesses with encroachment on the L5 roots. Status post left laminectomy at L5-S1. The central canal is widely patent. Disc and endplate spur cause moderately severe to severe foraminal narrowing, worse on the left. Prominent dorsal epidural fat and a shallow disc bulge at L3-4 cause moderate compression of the thecal sac. The foramina are open. Electronically Signed   By: Inge Rise M.D.   On: 09/13/2019 13:18    Procedures Procedures (including critical care time)  Medications Ordered in ED Medications  oxyCODONE-acetaminophen (PERCOCET/ROXICET) 5-325 MG per tablet 2 tablet (has no administration in time range)  dexamethasone (DECADRON) injection 10 mg (has no administration in time range)    ED Course  I have reviewed the triage vital signs and the nursing notes.  Pertinent labs & imaging results that were available during my care of the patient were reviewed by me and considered in my medical decision making (see chart for details).    MDM Rules/Calculators/A&P                          Patient is a 59 year old female who presents with worsening low back pain.  She reports some urine and stool incontinence.  Her urine difficulties seem to be consistent with more of a type of stress incontinence and is more of a chronic issue which is unchanged from her baseline.  She reports some stool incontinence although her stools have been loose so it is unclear whether she is more having some cramping in diarrhea versus actual true incontinence.  She seemed to have maybe a little decreased rectal tone on exam.  She does not have any motor or sensory deficits on exam.  Her labs are reviewed and are nonconcerning.  An MRI was performed which did show some evidence of moderate severe central disc protrusion at  the level of L4-L5.  I discussed this with Dr. Christella Noa who did not feel that this was significant for cauda equina syndrome.  He is supportive of discharge with follow-up with her neurosurgeon.  She was given a dose of Decadron and started on a short course of Vicodin.  She also will continue her meloxicam at home.  Return precautions were given. Final Clinical Impression(s) / ED Diagnoses Final diagnoses:  Acute right-sided low back pain with right-sided sciatica    Rx / DC Orders ED Discharge Orders         Ordered    HYDROcodone-acetaminophen (NORCO/VICODIN) 5-325 MG tablet  Every 6 hours PRN        09/13/19 1400           Malvin Johns, MD 09/13/19 1408

## 2019-09-23 ENCOUNTER — Other Ambulatory Visit: Payer: Self-pay

## 2019-09-24 ENCOUNTER — Ambulatory Visit (INDEPENDENT_AMBULATORY_CARE_PROVIDER_SITE_OTHER): Payer: BC Managed Care – PPO | Admitting: Family Medicine

## 2019-09-24 ENCOUNTER — Encounter: Payer: Self-pay | Admitting: Family Medicine

## 2019-09-24 ENCOUNTER — Other Ambulatory Visit: Payer: Self-pay

## 2019-09-24 VITALS — BP 122/70 | HR 84 | Wt 190.2 lb

## 2019-09-24 DIAGNOSIS — K219 Gastro-esophageal reflux disease without esophagitis: Secondary | ICD-10-CM | POA: Diagnosis not present

## 2019-09-24 DIAGNOSIS — M8949 Other hypertrophic osteoarthropathy, multiple sites: Secondary | ICD-10-CM | POA: Diagnosis not present

## 2019-09-24 DIAGNOSIS — I10 Essential (primary) hypertension: Secondary | ICD-10-CM

## 2019-09-24 DIAGNOSIS — F900 Attention-deficit hyperactivity disorder, predominantly inattentive type: Secondary | ICD-10-CM | POA: Diagnosis not present

## 2019-09-24 DIAGNOSIS — E782 Mixed hyperlipidemia: Secondary | ICD-10-CM | POA: Diagnosis not present

## 2019-09-24 DIAGNOSIS — M159 Polyosteoarthritis, unspecified: Secondary | ICD-10-CM

## 2019-09-24 MED ORDER — ALBUTEROL SULFATE HFA 108 (90 BASE) MCG/ACT IN AERS: 2.0000 | INHALATION_SPRAY | RESPIRATORY_TRACT | 5 refills | Status: DC | PRN

## 2019-09-24 MED ORDER — LOSARTAN POTASSIUM 50 MG PO TABS
50.0000 mg | ORAL_TABLET | Freq: Every day | ORAL | 3 refills | Status: DC
Start: 2019-09-24 — End: 2020-11-25

## 2019-09-24 MED ORDER — MELOXICAM 15 MG PO TABS
ORAL_TABLET | ORAL | 3 refills | Status: DC
Start: 2019-09-24 — End: 2019-11-18

## 2019-09-24 MED ORDER — TRAZODONE HCL 100 MG PO TABS
200.0000 mg | ORAL_TABLET | Freq: Every day | ORAL | 3 refills | Status: DC
Start: 2019-09-24 — End: 2020-11-25

## 2019-09-24 MED ORDER — OXYBUTYNIN CHLORIDE 5 MG PO TABS
10.0000 mg | ORAL_TABLET | Freq: Two times a day (BID) | ORAL | 3 refills | Status: DC
Start: 2019-09-24 — End: 2020-01-29

## 2019-09-24 NOTE — Progress Notes (Signed)
   Subjective:    Patient ID: Emily Avery, female    DOB: 11/19/1960, 59 y.o.   MRN: [id number]  HPI Here to follow up on issues. She feels well except for low back pain and for urinary and bowel incontinence. She was in the ER recently and had an MRI which showed a significant disc herniation at L4-5. She is scheduled to see Dr. Arnoldo Morale for this on 10-07-19. Her BP is stable.    Review of Systems  Constitutional: Negative.   Respiratory: Negative.   Cardiovascular: Negative.   Neurological: Negative.        Objective:   Physical Exam Constitutional:      Appearance: Normal appearance.  Cardiovascular:     Rate and Rhythm: Normal rate and regular rhythm.     Pulses: Normal pulses.     Heart sounds: Normal heart sounds.  Pulmonary:     Effort: Pulmonary effort is normal.     Breath sounds: Normal breath sounds.  Abdominal:     General: Abdomen is flat. Bowel sounds are normal. There is no distension.     Palpations: Abdomen is soft. There is no mass.     Tenderness: There is no abdominal tenderness. There is no guarding or rebound.     Hernia: No hernia is present.  Neurological:     Mental Status: She is alert.           Assessment & Plan:  We will refill her medications today and get fasting labs. She will see Dr. Arnoldo Morale as above.  Alysia Penna, MD

## 2019-09-25 LAB — LIPID PANEL
Cholesterol: 155 mg/dL (ref <200)
HDL: 32 mg/dL — ABNORMAL LOW (ref >=50)
LDL Cholesterol (Calc): 92 mg/dL (calc)
Non-HDL Cholesterol (Calc): 123 mg/dL (calc) (ref <130)
Total CHOL/HDL Ratio: 4.8 (calc) (ref <5.0)
Triglycerides: 223 mg/dL — ABNORMAL HIGH (ref <150)

## 2019-09-25 LAB — HEPATIC FUNCTION PANEL
AG Ratio: 2 (calc) (ref 1.0–2.5)
ALT: 18 U/L (ref 6–29)
AST: 16 U/L (ref 10–35)
Albumin: 4.3 g/dL (ref 3.6–5.1)
Alkaline phosphatase (APISO): 106 U/L (ref 37–153)
Bilirubin, Direct: 0.1 mg/dL (ref 0.0–0.2)
Globulin: 2.2 g/dL (calc) (ref 1.9–3.7)
Indirect Bilirubin: 0.3 mg/dL (calc) (ref 0.2–1.2)
Total Bilirubin: 0.4 mg/dL (ref 0.2–1.2)
Total Protein: 6.5 g/dL (ref 6.1–8.1)

## 2019-09-25 LAB — TSH: TSH: 2.01 mIU/L (ref 0.40–4.50)

## 2019-10-07 DIAGNOSIS — R32 Unspecified urinary incontinence: Secondary | ICD-10-CM | POA: Diagnosis not present

## 2019-10-07 DIAGNOSIS — M4316 Spondylolisthesis, lumbar region: Secondary | ICD-10-CM | POA: Diagnosis not present

## 2019-10-07 DIAGNOSIS — M48062 Spinal stenosis, lumbar region with neurogenic claudication: Secondary | ICD-10-CM | POA: Diagnosis not present

## 2019-10-24 HISTORY — PX: BACK SURGERY: SHX140

## 2019-10-28 ENCOUNTER — Other Ambulatory Visit: Payer: Self-pay | Admitting: Neurosurgery

## 2019-10-29 ENCOUNTER — Telehealth: Payer: Self-pay | Admitting: Family Medicine

## 2019-10-29 DIAGNOSIS — Z0000 Encounter for general adult medical examination without abnormal findings: Secondary | ICD-10-CM

## 2019-10-29 NOTE — Telephone Encounter (Signed)
pt need a referral to have a colonoscopy [phone]

## 2019-10-31 ENCOUNTER — Other Ambulatory Visit: Payer: Self-pay | Admitting: Neurosurgery

## 2019-10-31 NOTE — Telephone Encounter (Signed)
The referral was done  

## 2019-11-03 NOTE — Telephone Encounter (Signed)
Patient called and advised referral has been placed, she says she will have to do the colonoscopy next year. She asked for a refill on Ritalin, says she's been out for 2 weeks. I asked did she pick up in August when it was sent in for 1 month, she says yes. I asked was there a Rx for pickup in September, because the next refill is for 11/12/19. She says the pharmacy doesn't have another Rx. I called [employer] representative and spoke to [employer] about the refill request of the patient. She says there are 2 on file for September and October, so she will refill the September Rx they have. I advised the patient and she verbalized understanding.

## 2019-11-12 NOTE — Progress Notes (Signed)
Your procedure is scheduled on Monday, October 25th.  Report to [facility] Main Entrance "A" at 10:15 A.M., and check in at the Admitting office.  Call this number if you have problems the morning of surgery:  [phone]  Call [phone] if you have any questions prior to your surgery date Monday-Friday 8am-4pm   Remember:  Do not eat or drink after midnight the night before your surgery   Take these medicines the morning of surgery with A SIP OF WATER  atorvastatin (LIPITOR) oxybutynin (DITROPAN) sertraline (ZOLOFT)   If needed: acetaminophen (TYLENOL), cyclobenzaprine (FLEXERIL), lamoTRIgine (LAMICTAL),   albuterol (VENTOLIN HFA)/ inhaler - bring with you the morning of surgery   As of today, STOP taking meloxicam (MOBIC), any Aspirin (unless otherwise instructed by your surgeon) Aleve, Naproxen, Ibuprofen, Motrin, Advil, Goody's, BC's, all herbal medications, fish oil, and all vitamins.                  Do not wear jewelry, make up, or nail polish            Do not wear lotions, powders, perfumes, or deodorant.            Do not shave 48 hours prior to surgery.              Do not bring valuables to the hospital.            [facility] is not responsible for any belongings or valuables.  Do NOT Smoke (Tobacco/Vaping) or drink Alcohol 24 hours prior to your procedure If you use a CPAP at night, you may bring all equipment for your overnight stay.   Contacts, glasses, dentures or bridgework may not be worn into surgery.      For patients admitted to the hospital, discharge time will be determined by your treatment team.   Patients discharged the day of surgery will not be allowed to drive home, and someone needs to stay with them for 24 hours.  Special instructions:   - Preparing For Surgery  Before surgery, you can play an important role. Because skin is not sterile, your skin needs to be as free of germs as possible. You can reduce the number of germs on your  skin by washing with CHG (chlorahexidine gluconate) Soap before surgery.  CHG is an antiseptic cleaner which kills germs and bonds with the skin to continue killing germs even after washing.    Oral Hygiene is also important to reduce your risk of infection.  Remember - BRUSH YOUR TEETH THE MORNING OF SURGERY WITH YOUR REGULAR TOOTHPASTE  Please do not use if you have an allergy to CHG or antibacterial soaps. If your skin becomes reddened/irritated stop using the CHG.  Do not shave (including legs and underarms) for at least 48 hours prior to first CHG shower. It is OK to shave your face.  Please follow these instructions carefully.   1. Shower the NIGHT BEFORE SURGERY and the MORNING OF SURGERY with CHG Soap.   2. If you chose to wash your hair, wash your hair first as usual with your normal shampoo.  3. After you shampoo, rinse your hair and body thoroughly to remove the shampoo.  4. Use CHG as you would any other liquid soap. You can apply CHG directly to the skin and wash gently with a scrungie or a clean washcloth.   5. Apply the CHG Soap to your body ONLY FROM THE NECK DOWN.  Do not use on  open wounds or open sores. Avoid contact with your eyes, ears, mouth and genitals (private parts). Wash Face and genitals (private parts)  with your normal soap.   6. Wash thoroughly, paying special attention to the area where your surgery will be performed.  7. Thoroughly rinse your body with warm water from the neck down.  8. DO NOT shower/wash with your normal soap after using and rinsing off the CHG Soap.  9. Pat yourself dry with a CLEAN TOWEL.  10. Wear CLEAN PAJAMAS to bed the night before surgery  11. Place CLEAN SHEETS on your bed the night of your first shower and DO NOT SLEEP WITH PETS.  Day of Surgery: Wear Clean/Comfortable clothing the morning of surgery Do not apply any deodorants/lotions.   Remember to brush your teeth WITH YOUR REGULAR TOOTHPASTE.   Please read over the  following fact sheets that you were given.

## 2019-11-13 ENCOUNTER — Encounter (HOSPITAL_COMMUNITY): Payer: Self-pay

## 2019-11-13 ENCOUNTER — Encounter (HOSPITAL_COMMUNITY)
Admission: RE | Admit: 2019-11-13 | Discharge: 2019-11-13 | Disposition: A | Discharge status: Home / Self Care | Payer: BC Managed Care – PPO | Source: Ambulatory Visit | Attending: Neurosurgery | Admitting: Neurosurgery

## 2019-11-13 ENCOUNTER — Other Ambulatory Visit (HOSPITAL_COMMUNITY): Payer: BC Managed Care – PPO

## 2019-11-13 ENCOUNTER — Other Ambulatory Visit: Payer: Self-pay

## 2019-11-13 DIAGNOSIS — F319 Bipolar disorder, unspecified: Secondary | ICD-10-CM | POA: Insufficient documentation

## 2019-11-13 DIAGNOSIS — J45909 Unspecified asthma, uncomplicated: Secondary | ICD-10-CM | POA: Diagnosis not present

## 2019-11-13 DIAGNOSIS — Z79899 Other long term (current) drug therapy: Secondary | ICD-10-CM | POA: Insufficient documentation

## 2019-11-13 DIAGNOSIS — E785 Hyperlipidemia, unspecified: Secondary | ICD-10-CM | POA: Insufficient documentation

## 2019-11-13 DIAGNOSIS — Z791 Long term (current) use of non-steroidal anti-inflammatories (NSAID): Secondary | ICD-10-CM | POA: Diagnosis not present

## 2019-11-13 DIAGNOSIS — M4316 Spondylolisthesis, lumbar region: Secondary | ICD-10-CM | POA: Diagnosis not present

## 2019-11-13 DIAGNOSIS — N3281 Overactive bladder: Secondary | ICD-10-CM | POA: Diagnosis not present

## 2019-11-13 DIAGNOSIS — I10 Essential (primary) hypertension: Secondary | ICD-10-CM | POA: Insufficient documentation

## 2019-11-13 DIAGNOSIS — Z01818 Encounter for other preprocedural examination: Secondary | ICD-10-CM | POA: Diagnosis not present

## 2019-11-13 DIAGNOSIS — E669 Obesity, unspecified: Secondary | ICD-10-CM | POA: Insufficient documentation

## 2019-11-13 DIAGNOSIS — Z6835 Body mass index (BMI) 35.0-35.9, adult: Secondary | ICD-10-CM | POA: Diagnosis not present

## 2019-11-13 HISTORY — DX: Unspecified osteoarthritis, unspecified site: M19.90

## 2019-11-13 HISTORY — DX: Unspecified asthma, uncomplicated: J45.909

## 2019-11-13 LAB — SURGICAL PCR SCREEN
MRSA, PCR: NEGATIVE
Staphylococcus aureus: NEGATIVE

## 2019-11-13 LAB — BASIC METABOLIC PANEL
Anion gap: 12 (ref 5–15)
BUN: 13 mg/dL (ref 6–20)
CO2: 23 mmol/L (ref 22–32)
Calcium: 9.3 mg/dL (ref 8.9–10.3)
Chloride: 102 mmol/L (ref 98–111)
Creatinine, Ser: 0.6 mg/dL (ref 0.44–1.00)
GFR, Estimated: >60 mL/min (ref >60)
Glucose, Bld: 96 mg/dL (ref 70–99)
Potassium: 3.8 mmol/L (ref 3.5–5.1)
Sodium: 137 mmol/L (ref 135–145)

## 2019-11-13 LAB — CBC
HCT: 45.5 % (ref 36.0–46.0)
Hemoglobin: 15 g/dL (ref 12.0–15.0)
MCH: 30.7 pg (ref 26.0–34.0)
MCHC: 33 g/dL (ref 30.0–36.0)
MCV: 93 fL (ref 80.0–100.0)
Platelets: 384 10*3/uL (ref 150–400)
RBC: 4.89 MIL/uL (ref 3.87–5.11)
RDW: 12.2 % (ref 11.5–15.5)
WBC: 8.8 10*3/uL (ref 4.0–10.5)
nRBC: 0 % (ref 0.0–0.2)

## 2019-11-13 LAB — TYPE AND SCREEN
ABO/RH(D): B POS
Antibody Screen: NEGATIVE

## 2019-11-13 NOTE — Progress Notes (Signed)
PCP - Dr. Alysia Penna Cardiologist - Denies  PPM/ICD - Denies  Chest x-ray - N/A EKG - 11/13/19 Stress Test - Denies ECHO - Denies Cardiac Cath - Denies  Sleep Study - Denies  Patient denies having diabetes.  Blood Thinner Instructions: N/A Aspirin Instructions: N/A  ERAS Protcol - No    COVID TEST- 11/15/19   Anesthesia review: Yes, abnormal EKG  Patient denies shortness of breath, fever, cough and chest pain at PAT appointment   All instructions explained to the patient, with a verbal understanding of the material. Patient agrees to go over the instructions while at home for a better understanding. Patient also instructed to self quarantine after being tested for COVID-19. The opportunity to ask questions was provided.

## 2019-11-13 NOTE — Progress Notes (Signed)
   11/13/19 0916  OBSTRUCTIVE SLEEP APNEA  Have you ever been diagnosed with sleep apnea through a sleep study? No  Do you snore loudly (loud enough to be heard through closed doors)?  1  Do you often feel tired, fatigued, or sleepy during the daytime (such as falling asleep during driving or talking to someone)? 1  Has anyone observed you stop breathing during your sleep? 0  Do you have, or are you being treated for high blood pressure? 1  BMI more than 35 kg/m2? 1  Age > 50 (1-yes) 1  Neck circumference greater than:Female 16 inches or larger, Female 17inches or larger? 0  Female Gender (Yes=1) 0  Obstructive Sleep Apnea Score 5  Score 5 or greater  Results sent to PCP

## 2019-11-14 NOTE — Anesthesia Preprocedure Evaluation (Addendum)
Anesthesia Evaluation  Patient identified by MRN, date of birth, ID band Patient awake    Reviewed: Allergy & Precautions, NPO status , Patient's Chart, lab work & pertinent test results  Airway Mallampati: II  TM Distance: >3 FB Neck ROM: Full    Dental  (+) Dental Advisory Given   Pulmonary asthma , Current Smoker and Patient abstained from smoking.,    breath sounds clear to auscultation       Cardiovascular hypertension, Pt. on medications  Rhythm:Regular Rate:Normal     Neuro/Psych Anxiety Depression negative neurological ROS     GI/Hepatic Neg liver ROS, GERD  ,  Endo/Other  negative endocrine ROS  Renal/GU negative Renal ROS     Musculoskeletal  (+) Arthritis ,   Abdominal   Peds  Hematology negative hematology ROS (+)   Anesthesia Other Findings   Reproductive/Obstetrics                            Lab Results  Component Value Date   WBC 8.8 11/13/2019   HGB 15.0 11/13/2019   HCT 45.5 11/13/2019   MCV 93.0 11/13/2019   PLT 384 11/13/2019   Lab Results  Component Value Date   CREATININE 0.60 11/13/2019   BUN 13 11/13/2019   NA 137 11/13/2019   K 3.8 11/13/2019   CL 102 11/13/2019   CO2 23 11/13/2019    Anesthesia Physical Anesthesia Plan  ASA: II  Anesthesia Plan: General   Post-op Pain Management:    Induction: Intravenous  PONV Risk Score and Plan: 2 and Dexamethasone, Ondansetron and Treatment may vary due to age or medical condition  Airway Management Planned: Oral ETT  Additional Equipment:   Intra-op Plan:   Post-operative Plan: Extubation in OR  Informed Consent: I have reviewed the patients History and Physical, chart, labs and discussed the procedure including the risks, benefits and alternatives for the proposed anesthesia with the patient or authorized representative who has indicated his/her understanding and acceptance.     Dental advisory  given  Plan Discussed with: CRNA  Anesthesia Plan Comments: ( )       Anesthesia Quick Evaluation

## 2019-11-14 NOTE — Progress Notes (Signed)
Anesthesia Chart Review:  Case: [id number] Date/Time: 11/17/19 1200   Procedure: POSTERIOR LUMBAR INTERBODY FUSION, INTERBODY PROSTHESIS, POSTERIOR INSTRUMENTATION L4-L5 (N/A ) - 3C   Anesthesia type: General   Pre-op diagnosis: SPONDYLOLISTHESIS, LUMBAR REGION   Location: [place] OR ROOM 18 / [place] [facility]   Surgeons: Newman Pies, MD      DISCUSSION: Patient is a 59 year old female scheduled for the above procedure.  History includes smoking, HTN, HLD, asthma (as need albuterol MDI), GERD, Bipolar disorder, headaches, overactive bladder, diverticulitis, prior back (2015) and colon surgeries. BMI is consistent with obesity. OSA Screening score was 5.   She denied SOB, chest pain, cough, fever at PAT RN interview.   Presurgical COVID-19 test negative is scheduled for 11/15/19.  Anesthesia team to evaluate on the day of surgery.   VS: BP 118/78   Pulse 73   Temp 36.9 C (Oral)   Resp 18   Ht 5\' 1"  (1.549 m)   Wt 85.9 kg   LMP 04/22/2012   SpO2 99%   BMI 35.77 kg/m   PROVIDERS: Laurey Morale, MD is PCP. Last visit 09/24/19 for ED follow-up with MRI showing L4-5 disc herniation. She was referred to Dr. Arnoldo Morale with neurosurgery.    LABS: Labs reviewed: Acceptable for surgery. LFTs WNL 09/24/19.  (all labs ordered are listed, but only abnormal results are displayed)  Labs Reviewed  SURGICAL PCR SCREEN  CBC  BASIC METABOLIC PANEL  TYPE AND SCREEN     IMAGES: MRI L-spine 09/13/19: IMPRESSION: - Right paracentral disc protrusion and ligamentum flavum thickening cause moderately severe central canal stenosis at L4-5 and marked narrowing in the subarticular recesses with encroachment on the L5 roots. - Status post left laminectomy at L5-S1. The central canal is widely patent. Disc and endplate spur cause moderately severe to severe foraminal narrowing, worse on the left. - Prominent dorsal epidural fat and a shallow disc bulge at L3-4 cause moderate compression of the thecal sac. The  foramina are open.   EKG: 11/13/19: Normal sinus rhythm Septal infarct , age undetermined Abnormal ECG Confirmed by Lauree Chandler [phone]) on 11/13/2019 5:39:06 PM - No comparison EKG tracing in [facility] or [facility]   CV: Denied prior stress test, cardiac cath, echocardiogram   Past Medical History:  Diagnosis Date  . Anxiety   . Arthritis   . Asthma   . Depression    Bipolar  . Diverticulitis of colon   . GERD (gastroesophageal reflux disease)    diet controlled - no meds  . HA (headache)   . History of kidney stones    passed stones - no surgery  . Hyperlipidemia   . Hypertension   . Osteoporosis   . Overactive bladder   . SOB (shortness of breath) on exertion    uses inhaler prn    Past Surgical History:  Procedure Laterality Date  . BACK SURGERY    . COLON SURGERY    . COLONOSCOPY  06-25-09   per Dr. Fuller Plan, benign polyps, repeat in 5 yrs   . HAND SURGERY     middle finger on right hand   . LUMBAR DISC SURGERY  11-20-13   per Dr. Arnoldo Morale, at L5-S1     MEDICATIONS: . acetaminophen (TYLENOL) 500 MG tablet  . albuterol (VENTOLIN HFA) 108 (90 Base) MCG/ACT inhaler  . alprazolam (XANAX) 2 MG tablet  . atorvastatin (LIPITOR) 20 MG tablet  . cyclobenzaprine (FLEXERIL) 10 MG tablet  . HYDROcodone-acetaminophen (NORCO/VICODIN) 5-325 MG tablet  . lamoTRIgine (LAMICTAL)  100 MG tablet  . loperamide (IMODIUM A-D) 2 MG tablet  . losartan (COZAAR) 50 MG tablet  . meloxicam (MOBIC) 15 MG tablet  . methylphenidate (RITALIN LA) 20 MG 24 hr capsule  . oxybutynin (DITROPAN) 5 MG tablet  . sertraline (ZOLOFT) 100 MG tablet  . traZODone (DESYREL) 100 MG tablet   No current facility-administered medications for this encounter.    Myra Gianotti, PA-C Surgical Short Stay/Anesthesiology [facility] Phone [phone] [facility] Phone [phone] 11/14/2019 11:20 AM

## 2019-11-15 ENCOUNTER — Other Ambulatory Visit (HOSPITAL_COMMUNITY)
Admission: RE | Admit: 2019-11-15 | Discharge: 2019-11-15 | Disposition: A | Discharge status: Home / Self Care | Payer: BC Managed Care – PPO | Source: Ambulatory Visit | Attending: Neurosurgery | Admitting: Neurosurgery

## 2019-11-15 DIAGNOSIS — E785 Hyperlipidemia, unspecified: Secondary | ICD-10-CM | POA: Diagnosis not present

## 2019-11-15 DIAGNOSIS — F419 Anxiety disorder, unspecified: Secondary | ICD-10-CM | POA: Diagnosis not present

## 2019-11-15 DIAGNOSIS — F319 Bipolar disorder, unspecified: Secondary | ICD-10-CM | POA: Diagnosis not present

## 2019-11-15 DIAGNOSIS — Z981 Arthrodesis status: Secondary | ICD-10-CM | POA: Diagnosis not present

## 2019-11-15 DIAGNOSIS — Z791 Long term (current) use of non-steroidal anti-inflammatories (NSAID): Secondary | ICD-10-CM | POA: Diagnosis not present

## 2019-11-15 DIAGNOSIS — Z20822 Contact with and (suspected) exposure to covid-19: Secondary | ICD-10-CM | POA: Insufficient documentation

## 2019-11-15 DIAGNOSIS — M48062 Spinal stenosis, lumbar region with neurogenic claudication: Secondary | ICD-10-CM | POA: Diagnosis not present

## 2019-11-15 DIAGNOSIS — K219 Gastro-esophageal reflux disease without esophagitis: Secondary | ICD-10-CM | POA: Diagnosis not present

## 2019-11-15 DIAGNOSIS — M5116 Intervertebral disc disorders with radiculopathy, lumbar region: Secondary | ICD-10-CM | POA: Diagnosis not present

## 2019-11-15 DIAGNOSIS — M810 Age-related osteoporosis without current pathological fracture: Secondary | ICD-10-CM | POA: Diagnosis not present

## 2019-11-15 DIAGNOSIS — J45909 Unspecified asthma, uncomplicated: Secondary | ICD-10-CM | POA: Diagnosis not present

## 2019-11-15 DIAGNOSIS — Z8249 Family history of ischemic heart disease and other diseases of the circulatory system: Secondary | ICD-10-CM | POA: Diagnosis not present

## 2019-11-15 DIAGNOSIS — Z01812 Encounter for preprocedural laboratory examination: Secondary | ICD-10-CM | POA: Insufficient documentation

## 2019-11-15 DIAGNOSIS — M4316 Spondylolisthesis, lumbar region: Secondary | ICD-10-CM | POA: Diagnosis not present

## 2019-11-15 DIAGNOSIS — Z79899 Other long term (current) drug therapy: Secondary | ICD-10-CM | POA: Diagnosis not present

## 2019-11-15 DIAGNOSIS — I10 Essential (primary) hypertension: Secondary | ICD-10-CM | POA: Diagnosis not present

## 2019-11-15 DIAGNOSIS — M1990 Unspecified osteoarthritis, unspecified site: Secondary | ICD-10-CM | POA: Diagnosis not present

## 2019-11-15 DIAGNOSIS — F17210 Nicotine dependence, cigarettes, uncomplicated: Secondary | ICD-10-CM | POA: Diagnosis not present

## 2019-11-15 DIAGNOSIS — Z87442 Personal history of urinary calculi: Secondary | ICD-10-CM | POA: Diagnosis not present

## 2019-11-15 LAB — SARS CORONAVIRUS 2 (TAT 6-24 HRS): SARS Coronavirus 2: NEGATIVE

## 2019-11-17 ENCOUNTER — Inpatient Hospital Stay (HOSPITAL_COMMUNITY): Payer: BC Managed Care – PPO | Admitting: Vascular Surgery

## 2019-11-17 ENCOUNTER — Inpatient Hospital Stay (HOSPITAL_COMMUNITY)
Admission: RE | Admit: 2019-11-17 | Discharge: 2019-11-18 | DRG: 455 | Disposition: A | Discharge status: Home / Self Care | Payer: BC Managed Care – PPO | Attending: Neurosurgery | Admitting: Neurosurgery

## 2019-11-17 ENCOUNTER — Encounter (HOSPITAL_COMMUNITY): Payer: Self-pay | Admitting: Neurosurgery

## 2019-11-17 ENCOUNTER — Inpatient Hospital Stay (HOSPITAL_COMMUNITY): Payer: BC Managed Care – PPO | Admitting: Certified Registered"

## 2019-11-17 ENCOUNTER — Inpatient Hospital Stay (HOSPITAL_COMMUNITY): Payer: BC Managed Care – PPO

## 2019-11-17 ENCOUNTER — Encounter (HOSPITAL_COMMUNITY)
Admission: RE | Disposition: A | Discharge status: Home / Self Care | Payer: Self-pay | Source: Home / Self Care | Attending: Neurosurgery

## 2019-11-17 ENCOUNTER — Other Ambulatory Visit: Payer: Self-pay

## 2019-11-17 DIAGNOSIS — Z20822 Contact with and (suspected) exposure to covid-19: Secondary | ICD-10-CM | POA: Diagnosis present

## 2019-11-17 DIAGNOSIS — K219 Gastro-esophageal reflux disease without esophagitis: Secondary | ICD-10-CM | POA: Diagnosis present

## 2019-11-17 DIAGNOSIS — M4316 Spondylolisthesis, lumbar region: Secondary | ICD-10-CM | POA: Diagnosis present

## 2019-11-17 DIAGNOSIS — M1990 Unspecified osteoarthritis, unspecified site: Secondary | ICD-10-CM | POA: Diagnosis present

## 2019-11-17 DIAGNOSIS — I10 Essential (primary) hypertension: Secondary | ICD-10-CM | POA: Diagnosis present

## 2019-11-17 DIAGNOSIS — F17210 Nicotine dependence, cigarettes, uncomplicated: Secondary | ICD-10-CM | POA: Diagnosis present

## 2019-11-17 DIAGNOSIS — Z8249 Family history of ischemic heart disease and other diseases of the circulatory system: Secondary | ICD-10-CM

## 2019-11-17 DIAGNOSIS — J45909 Unspecified asthma, uncomplicated: Secondary | ICD-10-CM | POA: Diagnosis present

## 2019-11-17 DIAGNOSIS — Z87442 Personal history of urinary calculi: Secondary | ICD-10-CM

## 2019-11-17 DIAGNOSIS — F319 Bipolar disorder, unspecified: Secondary | ICD-10-CM | POA: Diagnosis present

## 2019-11-17 DIAGNOSIS — M810 Age-related osteoporosis without current pathological fracture: Secondary | ICD-10-CM | POA: Diagnosis present

## 2019-11-17 DIAGNOSIS — Z981 Arthrodesis status: Secondary | ICD-10-CM | POA: Diagnosis not present

## 2019-11-17 DIAGNOSIS — F419 Anxiety disorder, unspecified: Secondary | ICD-10-CM | POA: Diagnosis present

## 2019-11-17 DIAGNOSIS — E785 Hyperlipidemia, unspecified: Secondary | ICD-10-CM | POA: Diagnosis present

## 2019-11-17 DIAGNOSIS — Z79899 Other long term (current) drug therapy: Secondary | ICD-10-CM | POA: Diagnosis not present

## 2019-11-17 DIAGNOSIS — Z419 Encounter for procedure for purposes other than remedying health state, unspecified: Secondary | ICD-10-CM

## 2019-11-17 DIAGNOSIS — Z791 Long term (current) use of non-steroidal anti-inflammatories (NSAID): Secondary | ICD-10-CM | POA: Diagnosis not present

## 2019-11-17 DIAGNOSIS — M48062 Spinal stenosis, lumbar region with neurogenic claudication: Principal | ICD-10-CM | POA: Diagnosis present

## 2019-11-17 DIAGNOSIS — M5116 Intervertebral disc disorders with radiculopathy, lumbar region: Secondary | ICD-10-CM | POA: Diagnosis present

## 2019-11-17 LAB — ABO/RH: ABO/RH(D): B POS

## 2019-11-17 SURGERY — POSTERIOR LUMBAR FUSION 1 LEVEL
Anesthesia: General | Site: Back

## 2019-11-17 MED ORDER — ACETAMINOPHEN 325 MG PO TABS: 650.0000 mg | ORAL_TABLET | ORAL | Status: DC | PRN

## 2019-11-17 MED ORDER — DEXAMETHASONE SODIUM PHOSPHATE 10 MG/ML IJ SOLN
INTRAMUSCULAR | Status: DC | PRN
  Administered 2019-11-17: 10 mg via INTRAVENOUS

## 2019-11-17 MED ORDER — THROMBIN 5000 UNITS EX SOLR
OROMUCOSAL | Status: DC | PRN
  Administered 2019-11-17: 5 mL via TOPICAL

## 2019-11-17 MED ORDER — LOSARTAN POTASSIUM 50 MG PO TABS
50.0000 mg | ORAL_TABLET | Freq: Every day | ORAL | Status: DC
  Administered 2019-11-17: 50 mg via ORAL
  Filled 2019-11-17: qty 1

## 2019-11-17 MED ORDER — BUPIVACAINE-EPINEPHRINE 0.5% -1:200000 IJ SOLN
INTRAMUSCULAR | Status: AC
  Filled 2019-11-17: qty 1

## 2019-11-17 MED ORDER — MORPHINE SULFATE (PF) 4 MG/ML IV SOLN
INTRAVENOUS | Status: AC
  Filled 2019-11-17: qty 1

## 2019-11-17 MED ORDER — ACETAMINOPHEN 500 MG PO TABS
1000.0000 mg | ORAL_TABLET | Freq: Once | ORAL | Status: AC
  Administered 2019-11-17: 1000 mg via ORAL
  Filled 2019-11-17: qty 2

## 2019-11-17 MED ORDER — MIDAZOLAM HCL 2 MG/2ML IJ SOLN
1.0000 mg | Freq: Once | INTRAMUSCULAR | Status: AC
  Administered 2019-11-17: 1 mg via INTRAVENOUS

## 2019-11-17 MED ORDER — MIDAZOLAM HCL 5 MG/5ML IJ SOLN
INTRAMUSCULAR | Status: DC | PRN
  Administered 2019-11-17: 2 mg via INTRAVENOUS

## 2019-11-17 MED ORDER — MENTHOL 3 MG MT LOZG: 1.0000 | LOZENGE | OROMUCOSAL | Status: DC | PRN

## 2019-11-17 MED ORDER — OXYCODONE HCL 5 MG PO TABS: 5.0000 mg | ORAL_TABLET | ORAL | Status: DC | PRN

## 2019-11-17 MED ORDER — ORAL CARE MOUTH RINSE: 15.0000 mL | Freq: Once | OROMUCOSAL | Status: AC

## 2019-11-17 MED ORDER — CHLORHEXIDINE GLUCONATE CLOTH 2 % EX PADS: 6.0000 | MEDICATED_PAD | Freq: Once | CUTANEOUS | Status: DC

## 2019-11-17 MED ORDER — ONDANSETRON HCL 4 MG/2ML IJ SOLN
INTRAMUSCULAR | Status: AC
  Filled 2019-11-17: qty 2

## 2019-11-17 MED ORDER — BISACODYL 10 MG RE SUPP: 10.0000 mg | Freq: Every day | RECTAL | Status: DC | PRN

## 2019-11-17 MED ORDER — ALPRAZOLAM 0.5 MG PO TABS
2.0000 mg | ORAL_TABLET | Freq: Two times a day (BID) | ORAL | Status: DC | PRN
  Administered 2019-11-17: 2 mg via ORAL
  Filled 2019-11-17: qty 4

## 2019-11-17 MED ORDER — BACITRACIN ZINC 500 UNIT/GM EX OINT
TOPICAL_OINTMENT | CUTANEOUS | Status: AC
  Filled 2019-11-17: qty 28.35

## 2019-11-17 MED ORDER — CEFAZOLIN SODIUM-DEXTROSE 2-4 GM/100ML-% IV SOLN
2.0000 g | INTRAVENOUS | Status: AC
  Administered 2019-11-17: 2 g via INTRAVENOUS
  Filled 2019-11-17: qty 100

## 2019-11-17 MED ORDER — MIDAZOLAM HCL 2 MG/2ML IJ SOLN
INTRAMUSCULAR | Status: AC
  Filled 2019-11-17: qty 2

## 2019-11-17 MED ORDER — ROCURONIUM BROMIDE 10 MG/ML (PF) SYRINGE
PREFILLED_SYRINGE | INTRAVENOUS | Status: DC | PRN
  Administered 2019-11-17 (×2): 50 mg via INTRAVENOUS

## 2019-11-17 MED ORDER — EPHEDRINE SULFATE-NACL 50-0.9 MG/10ML-% IV SOSY
PREFILLED_SYRINGE | INTRAVENOUS | Status: DC | PRN
  Administered 2019-11-17: 5 mg via INTRAVENOUS

## 2019-11-17 MED ORDER — DEXAMETHASONE SODIUM PHOSPHATE 10 MG/ML IJ SOLN
INTRAMUSCULAR | Status: AC
  Filled 2019-11-17: qty 1

## 2019-11-17 MED ORDER — MORPHINE SULFATE (PF) 4 MG/ML IV SOLN: 4.0000 mg | INTRAVENOUS | Status: DC | PRN

## 2019-11-17 MED ORDER — OXYCODONE HCL 5 MG PO TABS
10.0000 mg | ORAL_TABLET | ORAL | Status: DC | PRN
  Administered 2019-11-17 – 2019-11-18 (×4): 10 mg via ORAL
  Filled 2019-11-17 (×4): qty 2

## 2019-11-17 MED ORDER — PHENYLEPHRINE HCL (PRESSORS) 10 MG/ML IV SOLN
INTRAVENOUS | Status: AC
  Filled 2019-11-17: qty 1

## 2019-11-17 MED ORDER — PHENOL 1.4 % MT LIQD: 1.0000 | OROMUCOSAL | Status: DC | PRN

## 2019-11-17 MED ORDER — ONDANSETRON HCL 4 MG PO TABS: 4.0000 mg | ORAL_TABLET | Freq: Four times a day (QID) | ORAL | Status: DC | PRN

## 2019-11-17 MED ORDER — ATORVASTATIN CALCIUM 10 MG PO TABS
20.0000 mg | ORAL_TABLET | Freq: Every day | ORAL | Status: DC
  Administered 2019-11-17: 20 mg via ORAL
  Filled 2019-11-17: qty 2

## 2019-11-17 MED ORDER — EPHEDRINE 5 MG/ML INJ
INTRAVENOUS | Status: AC
  Filled 2019-11-17: qty 10

## 2019-11-17 MED ORDER — FENTANYL CITRATE (PF) 250 MCG/5ML IJ SOLN
INTRAMUSCULAR | Status: AC
  Filled 2019-11-17: qty 5

## 2019-11-17 MED ORDER — CYCLOBENZAPRINE HCL 10 MG PO TABS: 10.0000 mg | ORAL_TABLET | Freq: Three times a day (TID) | ORAL | Status: DC | PRN

## 2019-11-17 MED ORDER — SODIUM CHLORIDE 0.9% FLUSH: 3.0000 mL | Freq: Two times a day (BID) | INTRAVENOUS | Status: DC

## 2019-11-17 MED ORDER — METHYLPHENIDATE HCL ER (LA) 10 MG PO CP24: 20.0000 mg | ORAL_CAPSULE | Freq: Every day | ORAL | Status: DC

## 2019-11-17 MED ORDER — CYCLOBENZAPRINE HCL 10 MG PO TABS
ORAL_TABLET | ORAL | Status: AC
  Filled 2019-11-17: qty 1

## 2019-11-17 MED ORDER — LACTATED RINGERS IV SOLN: INTRAVENOUS | Status: DC | PRN

## 2019-11-17 MED ORDER — AMISULPRIDE (ANTIEMETIC) 5 MG/2ML IV SOLN: 10.0000 mg | Freq: Once | INTRAVENOUS | Status: DC | PRN

## 2019-11-17 MED ORDER — SUGAMMADEX SODIUM 200 MG/2ML IV SOLN
INTRAVENOUS | Status: DC | PRN
  Administered 2019-11-17: 50 mg via INTRAVENOUS
  Administered 2019-11-17: 150 mg via INTRAVENOUS

## 2019-11-17 MED ORDER — METHYLPHENIDATE HCL ER (LA) 10 MG PO CP24: 20.0000 mg | ORAL_CAPSULE | ORAL | Status: DC

## 2019-11-17 MED ORDER — SERTRALINE HCL 50 MG PO TABS
100.0000 mg | ORAL_TABLET | Freq: Two times a day (BID) | ORAL | Status: DC
  Administered 2019-11-17: 100 mg via ORAL
  Filled 2019-11-17: qty 2

## 2019-11-17 MED ORDER — 0.9 % SODIUM CHLORIDE (POUR BTL) OPTIME
TOPICAL | Status: DC | PRN
  Administered 2019-11-17: 1000 mL

## 2019-11-17 MED ORDER — DEXMEDETOMIDINE (PRECEDEX) IN NS 20 MCG/5ML (4 MCG/ML) IV SYRINGE
PREFILLED_SYRINGE | INTRAVENOUS | Status: DC | PRN
  Administered 2019-11-17: 8 ug via INTRAVENOUS

## 2019-11-17 MED ORDER — LACTATED RINGERS IV SOLN: INTRAVENOUS | Status: DC

## 2019-11-17 MED ORDER — ONDANSETRON HCL 4 MG/2ML IJ SOLN
INTRAMUSCULAR | Status: DC | PRN
  Administered 2019-11-17: 4 mg via INTRAVENOUS

## 2019-11-17 MED ORDER — PROPOFOL 1000 MG/100ML IV EMUL
INTRAVENOUS | Status: AC
  Filled 2019-11-17: qty 100

## 2019-11-17 MED ORDER — LIDOCAINE 2% (20 MG/ML) 5 ML SYRINGE
INTRAMUSCULAR | Status: DC | PRN
  Administered 2019-11-17: 80 mg via INTRAVENOUS

## 2019-11-17 MED ORDER — FENTANYL CITRATE (PF) 100 MCG/2ML IJ SOLN
INTRAMUSCULAR | Status: AC
  Filled 2019-11-17: qty 2

## 2019-11-17 MED ORDER — ONDANSETRON HCL 4 MG/2ML IJ SOLN: 4.0000 mg | Freq: Four times a day (QID) | INTRAMUSCULAR | Status: DC | PRN

## 2019-11-17 MED ORDER — FENTANYL CITRATE (PF) 100 MCG/2ML IJ SOLN
25.0000 ug | INTRAMUSCULAR | Status: DC | PRN
  Administered 2019-11-17: 50 ug via INTRAVENOUS

## 2019-11-17 MED ORDER — CHLORHEXIDINE GLUCONATE 0.12 % MT SOLN
15.0000 mL | Freq: Once | OROMUCOSAL | Status: AC
  Administered 2019-11-17: 15 mL via OROMUCOSAL
  Filled 2019-11-17: qty 15

## 2019-11-17 MED ORDER — LAMOTRIGINE 100 MG PO TABS
200.0000 mg | ORAL_TABLET | Freq: Every day | ORAL | Status: DC
  Administered 2019-11-17: 200 mg via ORAL
  Filled 2019-11-17 (×2): qty 2

## 2019-11-17 MED ORDER — PROPOFOL 10 MG/ML IV BOLUS
INTRAVENOUS | Status: DC | PRN
  Administered 2019-11-17: 50 mg via INTRAVENOUS
  Administered 2019-11-17: 150 mg via INTRAVENOUS

## 2019-11-17 MED ORDER — SODIUM CHLORIDE 0.9 % IV SOLN: 250.0000 mL | INTRAVENOUS | Status: DC

## 2019-11-17 MED ORDER — BUPIVACAINE LIPOSOME 1.3 % IJ SUSP
20.0000 mL | Freq: Once | INTRAMUSCULAR | Status: AC
  Administered 2019-11-17: 20 mL
  Filled 2019-11-17: qty 20

## 2019-11-17 MED ORDER — PHENYLEPHRINE HCL-NACL 10-0.9 MG/250ML-% IV SOLN
INTRAVENOUS | Status: DC | PRN
  Administered 2019-11-17: 25 ug/min via INTRAVENOUS

## 2019-11-17 MED ORDER — BACITRACIN ZINC 500 UNIT/GM EX OINT
TOPICAL_OINTMENT | CUTANEOUS | Status: DC | PRN
  Administered 2019-11-17: 1 via TOPICAL

## 2019-11-17 MED ORDER — CYCLOBENZAPRINE HCL 10 MG PO TABS
10.0000 mg | ORAL_TABLET | Freq: Three times a day (TID) | ORAL | Status: DC | PRN
  Administered 2019-11-17: 10 mg via ORAL

## 2019-11-17 MED ORDER — PROPOFOL 10 MG/ML IV BOLUS
INTRAVENOUS | Status: AC
  Filled 2019-11-17: qty 20

## 2019-11-17 MED ORDER — FENTANYL CITRATE (PF) 250 MCG/5ML IJ SOLN
INTRAMUSCULAR | Status: DC | PRN
  Administered 2019-11-17 (×3): 50 ug via INTRAVENOUS
  Administered 2019-11-17: 100 ug via INTRAVENOUS

## 2019-11-17 MED ORDER — BUPIVACAINE-EPINEPHRINE (PF) 0.5% -1:200000 IJ SOLN
INTRAMUSCULAR | Status: DC | PRN
  Administered 2019-11-17: 10 mL

## 2019-11-17 MED ORDER — ALBUTEROL SULFATE HFA 108 (90 BASE) MCG/ACT IN AERS
2.0000 | INHALATION_SPRAY | RESPIRATORY_TRACT | Status: DC | PRN
  Filled 2019-11-17: qty 6.7

## 2019-11-17 MED ORDER — SODIUM CHLORIDE 0.9% FLUSH: 3.0000 mL | INTRAVENOUS | Status: DC | PRN

## 2019-11-17 MED ORDER — ZOLPIDEM TARTRATE 5 MG PO TABS: 5.0000 mg | ORAL_TABLET | Freq: Every evening | ORAL | Status: DC | PRN

## 2019-11-17 MED ORDER — ACETAMINOPHEN 650 MG RE SUPP: 650.0000 mg | RECTAL | Status: DC | PRN

## 2019-11-17 MED ORDER — DOCUSATE SODIUM 100 MG PO CAPS
100.0000 mg | ORAL_CAPSULE | Freq: Two times a day (BID) | ORAL | Status: DC
  Administered 2019-11-17: 100 mg via ORAL
  Filled 2019-11-17: qty 1

## 2019-11-17 MED ORDER — HYDROMORPHONE HCL 1 MG/ML IJ SOLN: 1.0000 mg | INTRAMUSCULAR | Status: DC | PRN

## 2019-11-17 MED ORDER — ACETAMINOPHEN 500 MG PO TABS
1000.0000 mg | ORAL_TABLET | Freq: Four times a day (QID) | ORAL | Status: DC
  Administered 2019-11-17 – 2019-11-18 (×3): 1000 mg via ORAL
  Filled 2019-11-17 (×3): qty 2

## 2019-11-17 MED ORDER — OXYBUTYNIN CHLORIDE 5 MG PO TABS
10.0000 mg | ORAL_TABLET | Freq: Two times a day (BID) | ORAL | Status: DC
  Administered 2019-11-17: 10 mg via ORAL
  Filled 2019-11-17 (×3): qty 2

## 2019-11-17 MED ORDER — CEFAZOLIN SODIUM-DEXTROSE 2-4 GM/100ML-% IV SOLN
2.0000 g | Freq: Three times a day (TID) | INTRAVENOUS | Status: AC
  Administered 2019-11-17 – 2019-11-18 (×2): 2 g via INTRAVENOUS
  Filled 2019-11-17 (×2): qty 100

## 2019-11-17 MED ORDER — THROMBIN 5000 UNITS EX SOLR
CUTANEOUS | Status: AC
  Filled 2019-11-17: qty 5000

## 2019-11-17 SURGICAL SUPPLY — 74 items
BENZOIN TINCTURE PRP APPL 2/3 (GAUZE/BANDAGES/DRESSINGS) ×2 IMPLANT
BLADE CLIPPER SURG (BLADE) IMPLANT
BUR MATCHSTICK NEURO 3.0 LAGG (BURR) ×2 IMPLANT
BUR PRECISION FLUTE 6.0 (BURR) ×2 IMPLANT
CAGE ALTERA 10X31X10-14 15D (Cage) ×2 IMPLANT
CANISTER SUCT 3000ML PPV (MISCELLANEOUS) ×2 IMPLANT
CAP LOCK DLX THRD (Cap) ×8 IMPLANT
CARTRIDGE OIL MAESTRO DRILL (MISCELLANEOUS) ×1 IMPLANT
CNTNR URN SCR LID CUP LEK RST (MISCELLANEOUS) ×1 IMPLANT
CONT SPEC 4OZ STRL OR WHT (MISCELLANEOUS) ×2
COVER BACK TABLE 60X90IN (DRAPES) ×2 IMPLANT
COVER WAND RF STERILE (DRAPES) ×2 IMPLANT
DECANTER SPIKE VIAL GLASS SM (MISCELLANEOUS) ×2 IMPLANT
DERMABOND ADVANCED (GAUZE/BANDAGES/DRESSINGS) ×1
DERMABOND ADVANCED .7 DNX12 (GAUZE/BANDAGES/DRESSINGS) ×1 IMPLANT
DIFFUSER DRILL AIR PNEUMATIC (MISCELLANEOUS) ×2 IMPLANT
DRAPE C-ARM 42X72 X-RAY (DRAPES) ×4 IMPLANT
DRAPE HALF SHEET 40X57 (DRAPES) ×2 IMPLANT
DRAPE LAPAROTOMY 100X72X124 (DRAPES) ×2 IMPLANT
DRAPE SURG 17X23 STRL (DRAPES) ×8 IMPLANT
DRSG OPSITE POSTOP 4X6 (GAUZE/BANDAGES/DRESSINGS) ×2 IMPLANT
DRSG OPSITE POSTOP 4X8 (GAUZE/BANDAGES/DRESSINGS) ×2 IMPLANT
ELECT BLADE 4.0 EZ CLEAN MEGAD (MISCELLANEOUS) ×2
ELECT REM PT RETURN 9FT ADLT (ELECTROSURGICAL) ×2
ELECTRODE BLDE 4.0 EZ CLN MEGD (MISCELLANEOUS) ×1 IMPLANT
ELECTRODE REM PT RTRN 9FT ADLT (ELECTROSURGICAL) ×1 IMPLANT
EVACUATOR 1/8 PVC DRAIN (DRAIN) IMPLANT
GAUZE 4X4 16PLY RFD (DISPOSABLE) ×2 IMPLANT
GLOVE BIO SURGEON STRL SZ 6.5 (GLOVE) ×2 IMPLANT
GLOVE BIO SURGEON STRL SZ8 (GLOVE) ×4 IMPLANT
GLOVE BIO SURGEON STRL SZ8.5 (GLOVE) ×4 IMPLANT
GLOVE BIOGEL PI IND STRL 6.5 (GLOVE) ×1 IMPLANT
GLOVE BIOGEL PI IND STRL 7.0 (GLOVE) ×1 IMPLANT
GLOVE BIOGEL PI IND STRL 7.5 (GLOVE) ×2 IMPLANT
GLOVE BIOGEL PI INDICATOR 6.5 (GLOVE) ×1
GLOVE BIOGEL PI INDICATOR 7.0 (GLOVE) ×1
GLOVE BIOGEL PI INDICATOR 7.5 (GLOVE) ×2
GLOVE BIOGEL PI ORTHO PRO 7.5 (GLOVE) ×1
GLOVE EXAM NITRILE XL STR (GLOVE) IMPLANT
GLOVE PI ORTHO PRO STRL 7.5 (GLOVE) ×1 IMPLANT
GLOVE SS BIOGEL STRL SZ 7 (GLOVE) ×2 IMPLANT
GLOVE SUPERSENSE BIOGEL SZ 7 (GLOVE) ×2
GLOVE SURG SS PI 7.0 STRL IVOR (GLOVE) ×2 IMPLANT
GOWN STRL REUS W/ TWL LRG LVL3 (GOWN DISPOSABLE) IMPLANT
GOWN STRL REUS W/ TWL XL LVL3 (GOWN DISPOSABLE) ×2 IMPLANT
GOWN STRL REUS W/TWL 2XL LVL3 (GOWN DISPOSABLE) IMPLANT
GOWN STRL REUS W/TWL LRG LVL3 (GOWN DISPOSABLE)
GOWN STRL REUS W/TWL XL LVL3 (GOWN DISPOSABLE) ×4
HEMOSTAT POWDER KIT SURGIFOAM (HEMOSTASIS) ×2 IMPLANT
KIT BASIN OR (CUSTOM PROCEDURE TRAY) ×2 IMPLANT
KIT TURNOVER KIT B (KITS) ×2 IMPLANT
MILL MEDIUM DISP (BLADE) ×2 IMPLANT
NEEDLE HYPO 21X1.5 SAFETY (NEEDLE) IMPLANT
NEEDLE HYPO 22GX1.5 SAFETY (NEEDLE) ×2 IMPLANT
NS IRRIG 1000ML POUR BTL (IV SOLUTION) ×2 IMPLANT
OIL CARTRIDGE MAESTRO DRILL (MISCELLANEOUS) ×2
PACK LAMINECTOMY NEURO (CUSTOM PROCEDURE TRAY) ×2 IMPLANT
PAD ARMBOARD 7.5X6 YLW CONV (MISCELLANEOUS) ×6 IMPLANT
PATTIES SURGICAL .5 X1 (DISPOSABLE) IMPLANT
PUTTY DBM 10CC CALC GRAN (Putty) ×2 IMPLANT
ROD CURVED TI 6.35X45 (Rod) ×4 IMPLANT
SCREW PA DLX CREO 7.5X55 (Screw) ×8 IMPLANT
SPONGE LAP 4X18 RFD (DISPOSABLE) IMPLANT
SPONGE NEURO XRAY DETECT 1X3 (DISPOSABLE) IMPLANT
SPONGE SURGIFOAM ABS GEL 100 (HEMOSTASIS) IMPLANT
STRIP CLOSURE SKIN 1/2X4 (GAUZE/BANDAGES/DRESSINGS) ×2 IMPLANT
SUT VIC AB 1 CT1 18XBRD ANBCTR (SUTURE) ×2 IMPLANT
SUT VIC AB 1 CT1 8-18 (SUTURE) ×4
SUT VIC AB 2-0 CP2 18 (SUTURE) ×4 IMPLANT
SYR 20ML LL LF (SYRINGE) IMPLANT
TOWEL GREEN STERILE (TOWEL DISPOSABLE) ×2 IMPLANT
TOWEL GREEN STERILE FF (TOWEL DISPOSABLE) ×2 IMPLANT
TRAY FOLEY MTR SLVR 16FR STAT (SET/KITS/TRAYS/PACK) ×2 IMPLANT
WATER STERILE IRR 1000ML POUR (IV SOLUTION) ×2 IMPLANT

## 2019-11-17 NOTE — Progress Notes (Signed)
Orthopedic Tech Progress Note Patient Details:  Emily Avery September 30, 1960 [id number] RN said patient has BRACE Patient ID: Emily Avery, female   DOB: 01-30-1960, 59 y.o.   MRN: [id number]   Janit Pagan 11/17/2019, 7:18 PM

## 2019-11-17 NOTE — H&P (Signed)
Subjective: The patient is a 59 year old white female who has complained of back and bilateral leg pain consistent with neurogenic claudication.  She has failed medical management and was worked up with a lumbar MRI and lumbar x-rays which demonstrated an L4-5 spinal listhesis, facet arthropathy and spinal stenosis.  I discussed the various treatment options with her.  She has decided to proceed with surgery.  Past Medical History:  Diagnosis Date  . Anxiety   . Arthritis   . Asthma   . Depression    Bipolar  . Diverticulitis of colon   . GERD (gastroesophageal reflux disease)    diet controlled - no meds  . HA (headache)   . History of kidney stones    passed stones - no surgery  . Hyperlipidemia   . Hypertension   . Osteoporosis   . Overactive bladder   . SOB (shortness of breath) on exertion    uses inhaler prn    Past Surgical History:  Procedure Laterality Date  . BACK SURGERY    . COLON SURGERY    . COLONOSCOPY  06-25-09   per Dr. Fuller Plan, benign polyps, repeat in 5 yrs   . HAND SURGERY     middle finger on right hand   . LUMBAR DISC SURGERY  11-20-13   per Dr. Arnoldo Morale, at L5-S1     Allergies  Allergen Reactions  . Codeine Nausea And Vomiting  . Lisinopril Cough    Social History   Tobacco Use  . Smoking status: Current Every Day Smoker    Packs/day: 1.00    Years: 42.00    Pack years: 42.00    Types: Cigarettes  . Smokeless tobacco: Never Used  Substance Use Topics  . Alcohol use: Yes    Alcohol/week: 0.0 standard drinks    Comment: rare    Family History  Problem Relation Age of Onset  . Alcohol abuse Other   . Arthritis Other   . Diabetes Other   . Hypertension Other   . Heart disease Other   . Colon cancer Maternal Grandfather   . Rectal cancer Neg Hx   . Stomach cancer Neg Hx    Prior to Admission medications   Medication Sig Start Date End Date Taking? Authorizing Provider  acetaminophen (TYLENOL) 500 MG tablet Take 1,000 mg by mouth every 6  (six) hours as needed for moderate pain or headache.   Yes [provider]  albuterol (VENTOLIN HFA) 108 (90 Base) MCG/ACT inhaler Inhale 2 puffs into the lungs every 4 (four) hours as needed for wheezing or shortness of breath. 09/24/19  Yes Laurey Morale, MD  atorvastatin (LIPITOR) 20 MG tablet TAKE 1 TABLET EVERY DAY Patient taking differently: Take 20 mg by mouth daily.  05/22/19  Yes Laurey Morale, MD  lamoTRIgine (LAMICTAL) 100 MG tablet TAKE 2 TABLETS EVERY DAY Patient taking differently: Take 200 mg by mouth daily.  07/21/19  Yes Laurey Morale, MD  loperamide (IMODIUM A-D) 2 MG tablet Take 4 mg by mouth 4 (four) times daily as needed for diarrhea or loose stools.   Yes [provider]  losartan (COZAAR) 50 MG tablet Take 1 tablet (50 mg total) by mouth daily. 09/24/19  Yes Laurey Morale, MD  meloxicam (MOBIC) 15 MG tablet TAKE 1 TABLET EVERY DAY Patient taking differently: Take 15 mg by mouth daily.  09/24/19  Yes Laurey Morale, MD  methylphenidate (RITALIN LA) 20 MG 24 hr capsule Take 1 capsule (20 mg  total) by mouth every morning. 11/12/19 12/12/19 Yes Laurey Morale, MD  oxybutynin (DITROPAN) 5 MG tablet Take 2 tablets (10 mg total) by mouth 2 (two) times daily. 09/24/19  Yes Laurey Morale, MD  sertraline (ZOLOFT) 100 MG tablet TAKE 1 TABLET BY MOUTH TWICE DAILY Patient taking differently: Take 100 mg by mouth 2 (two) times daily.  02/05/19  Yes Laurey Morale, MD  traZODone (DESYREL) 100 MG tablet Take 2 tablets (200 mg total) by mouth at bedtime. 09/24/19  Yes Laurey Morale, MD  alprazolam [name]) 2 MG tablet TAKE 1 TABLET BY MOUTH TWICE DAILY AS NEEDED FOR ANXIETY Patient taking differently: Take 2 mg by mouth at bedtime.  08/27/19   Laurey Morale, MD  cyclobenzaprine (FLEXERIL) 10 MG tablet TAKE ONE TABLET BY MOUTH THREE TIMES DAILY AS NEEDED FOR MUSCLE SPASMS Patient taking differently: Take 10 mg by mouth 3 (three) times daily as needed for muscle spasms.  07/09/18   Laurey Morale, MD  HYDROcodone-acetaminophen (NORCO/VICODIN) 5-325 MG tablet Take 1-2 tablets by mouth every 6 (six) hours as needed. Patient not taking: Reported on 11/05/2019 09/13/19   Malvin Johns, MD     Review of Systems  Positive ROS: As above  All other systems have been reviewed and were otherwise negative with the exception of those mentioned in the HPI and as above.  Objective: Vital signs in last 24 hours: Temp:  [98.7 F (37.1 C)] 98.7 F (37.1 C) (10/25 0926) Pulse Rate:  [69] 69 (10/25 0926) Resp:  [17] 17 (10/25 0926) BP: (165)/(83) 165/83 (10/25 0926) SpO2:  [96 %] 96 % (10/25 0926) Weight:  [85.7 kg] 85.7 kg (10/25 0926) Estimated body mass index is 35.71 kg/m as calculated from the following:   Height as of this encounter: 5\' 1"  (1.549 m).   Weight as of this encounter: 85.7 kg.   General Appearance: Alert Head: Normocephalic, without obvious abnormality, atraumatic Eyes: PERRL, conjunctiva/corneas clear, EOM's intact,    Ears: Normal  Throat: Normal  Neck: Supple, Back: unremarkable Lungs: Clear to auscultation bilaterally, respirations unlabored Heart: Regular rate and rhythm, no murmur, rub or gallop Abdomen: Soft, non-tender Extremities: Extremities normal, atraumatic, no cyanosis or edema Skin: unremarkable  NEUROLOGIC:   Mental status: alert and oriented,Motor Exam - grossly normal Sensory Exam - grossly normal Reflexes:  Coordination - grossly normal Gait - grossly normal Balance - grossly normal Cranial Nerves: I: smell Not tested  II: visual acuity  OS: Normal  OD: Normal   II: visual fields Full to confrontation  II: pupils Equal, round, reactive to light  III,VII: ptosis None  III,IV,VI: extraocular muscles  Full ROM  V: mastication Normal  V: facial light touch sensation  Normal  V,VII: corneal reflex  Present  VII: facial muscle function - upper  Normal  VII: facial muscle function - lower Normal  VIII: hearing Not tested  IX:  soft palate elevation  Normal  IX,X: gag reflex Present  XI: trapezius strength  5/5  XI: sternocleidomastoid strength 5/5  XI: neck flexion strength  5/5  XII: tongue strength  Normal    Data Review Lab Results  Component Value Date   WBC 8.8 11/13/2019   HGB 15.0 11/13/2019   HCT 45.5 11/13/2019   MCV 93.0 11/13/2019   PLT 384 11/13/2019   Lab Results  Component Value Date   NA 137 11/13/2019   K 3.8 11/13/2019   CL 102 11/13/2019   CO2 23 11/13/2019  BUN 13 11/13/2019   CREATININE 0.60 11/13/2019   GLUCOSE 96 11/13/2019   No results found for: INR, PROTIME  Assessment/Plan: L4-5 spondylolisthesis, spinal stenosis, facet arthropathy, lumbago, lumbar radiculopathy, neurogenic claudication: I have discussed the situation with the patient.  I have reviewed her imaging studies with her and pointed out the abnormalities.  We have discussed the various treatment options including surgery.  I have described the surgical treatment option of an L4-5 decompression, instrumentation and fusion.  I have shown her surgical models.  I have given her a surgical pamphlet.  We have discussed the risk, benefits, alternatives, expected postop course, and likelihood of achieving our goals with surgery.  I have answered all her questions.  She has decided to proceed with surgery.   Ophelia Charter 11/17/2019 1:42 PM

## 2019-11-17 NOTE — Op Note (Signed)
Brief history: The patient is a 59 year old white female who has complained of back and bilateral leg pain consistent with lumbar radiculopathy/neurogenic claudication.  She has failed medical management and was worked up with a lumbar MRI and lumbar x-rays which demonstrated L4-5 spondylolisthesis, facet arthropathy, spinal stenosis, etc.  I discussed the various treatment options with the patient including surgery.  She has weighed the risks, benefits and alternatives and decided proceed with an L4-5 decompression, instrumentation and fusion.  Preoperative diagnosis: L4-5 spondylolisthesis, facet arthropathy, degenerative disc disease, spinal stenosis compressing both the L4 and the L5 nerve roots; lumbago; lumbar radiculopathy; neurogenic claudication  Postoperative diagnosis: The same  Procedure: Bilateral L4-5 laminotomy/foraminotomies/medial facetectomy to decompress the bilateral L4 and L5 nerve roots(the work required to do this was in addition to the work required to do the posterior lumbar interbody fusion because of the patient's spinal stenosis, facet arthropathy. Etc. requiring a wide decompression of the nerve roots.);  L4-5 transforaminal lumbar interbody fusion with local morselized autograft bone and Zimmer DBM; insertion of interbody prosthesis at L4-5 (globus peek expandable interbody prosthesis); posterior nonsegmental instrumentation from L4 to L5 with globus titanium pedicle screws and rods; posterior lateral arthrodesis at L4-5 with local morselized autograft bone and Zimmer DBM.  Surgeon: Dr. Earle Gell  Asst.: Arnetha Massy, NP  Anesthesia: Gen. endotracheal  Estimated blood loss: 200 cc  Drains: None  Complications: None  Description of procedure: The patient was brought to the operating room by the anesthesia team. General endotracheal anesthesia was induced. The patient was turned to the prone position on the Wilson frame. The patient's lumbosacral region was then  prepared with Betadine scrub and Betadine solution. Sterile drapes were applied.  I then injected the area to be incised with Marcaine with epinephrine solution. I then used the scalpel to make a linear midline incision over the L4-5 interspace. I then used electrocautery to perform a bilateral subperiosteal dissection exposing the spinous process and lamina of L4 and L5. We then obtained intraoperative radiograph to confirm our location. We then inserted the Verstrac retractor to provide exposure.  I began the decompression by using the high speed drill to perform laminotomies at L4-5 bilaterally. We then used the Kerrison punches to widen the laminotomy and removed the ligamentum flavum at L4-5 bilaterally. We used the Kerrison punches to remove the medial facets at L4-5 bilaterally. We performed wide foraminotomies about the bilateral L4 and L5 nerve roots completing the decompression.  We now turned our attention to the posterior lumbar interbody fusion. I used a scalpel to incise the intervertebral disc at L4-5 bilaterally. I then performed a partial intervertebral discectomy at L4-5 bilaterally using the pituitary forceps. We prepared the vertebral endplates at B7-6 bilaterally for the fusion by removing the soft tissues with the curettes. We then used the trial spacers to pick the appropriate sized interbody prosthesis. We prefilled his prosthesis with a combination of local morselized autograft bone that we obtained during the decompression as well as Zimmer DBM. We inserted the prefilled prosthesis into the interspace at L4-5 from the right, we then turned and expanded the prosthesis. There was a good snug fit of the prosthesis in the interspace. We then filled and the remainder of the intervertebral disc space with local morselized autograft bone and Zimmer DBM. This completed the posterior lumbar interbody arthrodesis.  During the decompression and insertion of the prosthesis the assistant protected  the thecal sac and nerve roots with the D'Errico retractor.  We now turned attention  to the instrumentation. Under fluoroscopic guidance we cannulated the bilateral L4 and L5 pedicles with the bone probe. We then removed the bone probe. We then tapped the pedicle with a 6.5 millimeter tap. We then removed the tap. We probed inside the tapped pedicle with a ball probe to rule out cortical breaches. We then inserted a 7.5 x 55 millimeter pedicle screw into the L4 and L5 pedicles bilaterally under fluoroscopic guidance. We then palpated along the medial aspect of the pedicles to rule out cortical breaches. There were none. The nerve roots were not injured. We then connected the unilateral pedicle screws with a lordotic rod. We compressed the construct and secured the rod in place with the caps. We then tightened the caps appropriately. This completed the instrumentation from L4-5 bilaterally.  We now turned our attention to the posterior lateral arthrodesis at L4-5 bilaterally. We used the high-speed drill to decorticate the remainder of the facets, pars, transverse process at L4-5 bilaterally. We then applied a combination of local morselized autograft bone and Zimmer DBM over these decorticated posterior lateral structures. This completed the posterior lateral arthrodesis.  We then obtained hemostasis using bipolar electrocautery. We irrigated the wound out with bacitracin solution. We inspected the thecal sac and nerve roots and noted they were well decompressed. We then removed the retractor.  We injected Exparel . We reapproximated patient's thoracolumbar fascia with interrupted #1 Vicryl suture. We reapproximated patient's subcutaneous tissue with interrupted 2-0 Vicryl suture. The reapproximated patient's skin with Steri-Strips and benzoin. The wound was then coated with bacitracin ointment. A sterile dressing was applied. The drapes were removed. The patient was subsequently returned to the supine  position where they were extubated by the anesthesia team. He was then transported to the post anesthesia care unit in stable condition. All sponge instrument and needle counts were reportedly correct at the end of this case.

## 2019-11-17 NOTE — Anesthesia Procedure Notes (Signed)
Procedure Name: Intubation Date/Time: 11/17/2019 1:59 PM Performed by: Gaylene Brooks, CRNA Pre-anesthesia Checklist: Patient identified, Emergency Drugs available, Suction available and Patient being monitored Patient Re-evaluated:Patient Re-evaluated prior to induction Oxygen Delivery Method: Circle System Utilized Preoxygenation: Pre-oxygenation with 100% oxygen Induction Type: IV induction Ventilation: Mask ventilation without difficulty, Two handed mask ventilation required and Oral airway inserted - appropriate to patient size Laryngoscope Size: Sabra Heck and 2 Grade View: Grade I Tube type: Oral Tube size: 7.0 mm Number of attempts: 1 Airway Equipment and Method: Stylet and Oral airway Placement Confirmation: ETT inserted through vocal cords under direct vision,  positive ETCO2 and breath sounds checked- equal and bilateral Secured at: 22 cm Tube secured with: Tape Dental Injury: Teeth and Oropharynx as per pre-operative assessment

## 2019-11-17 NOTE — Transfer of Care (Signed)
Immediate Anesthesia Transfer of Care Note  Patient: Emily Avery  Procedure(s) Performed: POSTERIOR LUMBAR INTERBODY FUSION, INTERBODY PROSTHESIS, POSTERIOR INSTRUMENTATION L four-L five (N/A Back)  Patient Location: PACU  Anesthesia Type:General  Level of Consciousness: awake and confused  Airway & Oxygen Therapy: Patient Spontanous Breathing  Post-op Assessment: Report given to RN, Post -op Vital signs reviewed and stable and Patient moving all extremities  Post vital signs: Reviewed and stable  Last Vitals:  Vitals Value Taken Time  BP 154/78   Temp    Pulse 86 11/17/19 1651  Resp 24 11/17/19 1649  SpO2 92 % 11/17/19 1651  Vitals shown include unvalidated device data.  Last Pain:  Vitals:   11/17/19 1004  TempSrc:   PainSc: 8       Patients Stated Pain Goal: 2 (18/84/16 6063)  Complications: No complications documented.

## 2019-11-18 LAB — BASIC METABOLIC PANEL
Anion gap: 12 (ref 5–15)
BUN: 11 mg/dL (ref 6–20)
CO2: 23 mmol/L (ref 22–32)
Calcium: 9 mg/dL (ref 8.9–10.3)
Chloride: 102 mmol/L (ref 98–111)
Creatinine, Ser: 0.72 mg/dL (ref 0.44–1.00)
GFR, Estimated: >60 mL/min (ref >60)
Glucose, Bld: 102 mg/dL — ABNORMAL HIGH (ref 70–99)
Potassium: 4.8 mmol/L (ref 3.5–5.1)
Sodium: 137 mmol/L (ref 135–145)

## 2019-11-18 LAB — CBC
HCT: 45.8 % (ref 36.0–46.0)
Hemoglobin: 15.5 g/dL — ABNORMAL HIGH (ref 12.0–15.0)
MCH: 31.6 pg (ref 26.0–34.0)
MCHC: 33.8 g/dL (ref 30.0–36.0)
MCV: 93.5 fL (ref 80.0–100.0)
Platelets: 316 10*3/uL (ref 150–400)
RBC: 4.9 MIL/uL (ref 3.87–5.11)
RDW: 12.3 % (ref 11.5–15.5)
WBC: 15.8 10*3/uL — ABNORMAL HIGH (ref 4.0–10.5)
nRBC: 0 % (ref 0.0–0.2)

## 2019-11-18 MED ORDER — DOCUSATE SODIUM 100 MG PO CAPS
100.0000 mg | ORAL_CAPSULE | Freq: Two times a day (BID) | ORAL | 0 refills | Status: DC
Start: 2019-11-18 — End: 2020-02-16

## 2019-11-18 MED ORDER — OXYCODONE-ACETAMINOPHEN 5-325 MG PO TABS
1.0000 | ORAL_TABLET | ORAL | 0 refills | Status: DC | PRN
Start: 2019-11-18 — End: 2020-02-16

## 2019-11-18 MED ORDER — OXYCODONE-ACETAMINOPHEN 5-325 MG PO TABS: 1.0000 | ORAL_TABLET | ORAL | Status: DC | PRN

## 2019-11-18 NOTE — Evaluation (Signed)
Occupational Therapy Evaluation Patient Details Name: Emily Avery MRN: [id number] DOB: 03-07-1960 Today's Date: 11/18/2019    History of Present Illness Emily Avery is a 59 year old white female s/p L4-5 decompression, instrumentation and fusion on the patient on 11/17/2019. PMH includes but not limited to anxiety, arthritis, bipolar depression, GERD, hyperlipidemia, hypertension, and osteoporosis.    Clinical Impression   PTA, pt was living alone in a 1 story home with 2-3 steps to enter, she was independent with ADL/IADL and functional mobility. Pt currently requires supervision for full body dressing completed sitting EOB, she required supervision for toilet transfer and grooming while standing at sink level. Pt plans to d/c home to her mom's house. Her mom is able to provide supervision and setup assistance.Patient evaluated by Occupational Therapy with no further acute OT needs identified. All education has been completed and the patient has no further questions. See below for any follow-up Occupational Therapy or equipment needs. OT to sign off. Thank you for referral.     Follow Up Recommendations  No OT follow up;Supervision - Intermittent (with mobility)    Equipment Recommendations  3 in 1 bedside commode    Recommendations for Other Services PT consult     Precautions / Restrictions Precautions Precautions: Back Precaution Booklet Issued: Yes (comment) (provided handout and reviewed precautions) Required Braces or Orthoses: Spinal Brace Spinal Brace: Lumbar corset;Applied in sitting position Restrictions Weight Bearing Restrictions: No      Mobility Bed Mobility Overal bed mobility: Needs Assistance Bed Mobility: Rolling;Sidelying to Sit Rolling: Supervision Sidelying to sit: Supervision       General bed mobility comments: supervision for proper log rolling technique, with 1 vc pt able to safely complete bed mobility    Transfers Overall  transfer level: Needs assistance Equipment used: None Transfers: Sit to/from Stand Sit to Stand: Supervision         General transfer comment: supervision for safety    Balance Overall balance assessment: Mild deficits observed, not formally tested (pt demonstrated preference for support on furniture, no LOB )                                         ADL either performed or assessed with clinical judgement   ADL Overall ADL's : Needs assistance/impaired                                       General ADL Comments: Pt required supervision for grooming at sink level, supervision for functional mobility without AD (no loss of balance during mobility but pt demonstrating preference for UE support on furniture, discussed with PT pt may benefit from RW), pt completed UB/LB dressing sitting EOB, able to figure-4 bilateral LE. Pt donned/doffed back brace with supervision, 1 vc for proper positioning. Educated pt on compensatory strategies for back precautions     Vision         Perception     Praxis      Pertinent Vitals/Pain Pain Assessment: Faces Faces Pain Scale: Hurts a little bit Pain Location: back incision site with sit<>stand/mobility Pain Descriptors / Indicators: Guarding;Grimacing Pain Intervention(s): Limited activity within patient's tolerance;Monitored during session     Hand Dominance Right   Extremity/Trunk Assessment Upper Extremity Assessment Upper Extremity Assessment: Overall WFL for tasks assessed   Lower Extremity  Assessment Lower Extremity Assessment: Defer to PT evaluation   Cervical / Trunk Assessment Cervical / Trunk Assessment: Other exceptions Cervical / Trunk Exceptions: back precautions   Communication Communication Communication: No difficulties   Cognition Arousal/Alertness: Awake/alert Behavior During Therapy: WFL for tasks assessed/performed Overall Cognitive Status: Within Functional Limits for tasks  assessed                                 General Comments: pt appeared "cloudy" during session, began to apply deodorant with cap, but able to correct herself and made a comment how medication affects her. Pt oriented x4, demonstrated good adherence to back precautions    General Comments  VSS, incision site honeycomb dressing intact    Exercises     Shoulder Instructions      Home Living Family/patient expects to be discharged to:: Private residence Living Arrangements: Alone Available Help at Discharge: Family Type of Home: House Home Access: Stairs to enter Technical brewer of Steps: 2-3 Entrance Stairs-Rails: Can reach both Home Layout: One level     Bathroom Shower/Tub: Teacher, early years/pre: Standard Bathroom Accessibility: Yes How Accessible: Accessible via walker Home Equipment: None   Additional Comments: Pt will be d/c to her mom's house for the initial week. Mom's house has 2-3 steps to enter, one level house with a tub shower and handicap height toilet. Her mom has a RW available for pt to use, mom will be able to provide setup assistance for pt      Prior Functioning/Environment Level of Independence: Independent        Comments: pt was living alone, working, and independent with all ADL/IADL        OT Problem List: Decreased range of motion;Impaired balance (sitting and/or standing);Decreased knowledge of precautions;Pain      OT Treatment/Interventions:      OT Goals(Current goals can be found in the care plan section) Acute Rehab OT Goals Patient Stated Goal: to go home to her mom's house OT Goal Formulation: With patient Time For Goal Achievement: 11/25/19 Potential to Achieve Goals: Good  OT Frequency:     Barriers to D/C:            Co-evaluation              AM-PAC OT "6 Clicks" Daily Activity     Outcome Measure Help from another person eating meals?: None Help from another person taking care of  personal grooming?: A Little Help from another person toileting, which includes using toliet, bedpan, or urinal?: A Little Help from another person bathing (including washing, rinsing, drying)?: A Little Help from another person to put on and taking off regular upper body clothing?: A Little Help from another person to put on and taking off regular lower body clothing?: A Little 6 Click Score: 19   End of Session Equipment Utilized During Treatment: Back brace Nurse Communication: Mobility status  Activity Tolerance: Patient tolerated treatment well Patient left: in chair;with call bell/phone within reach  OT Visit Diagnosis: Other abnormalities of gait and mobility (R26.89);Pain Pain - part of body:  (back incision site)                Time: 6767-2094 OT Time Calculation (min): 27 min Charges:  OT General Charges $OT Visit: 1 Visit OT Evaluation $OT Eval Moderate Complexity: 1 Mod OT Treatments $Self Care/Home Management : 8-22 mins  Helene Kelp OTR/L Acute Rehabilitation  Services Office: [place] 11/18/2019, 9:34 AM

## 2019-11-18 NOTE — Progress Notes (Signed)
Pt given D/C instructions with verbal understanding. Rx's were sent to the pharmacy by MD. Pt's incison is clean and dry with no sign of infection. Pt's IV was removed prior to D/C. Pt D/C'd home via wheelchair per MD order. Pt is stable @ D/C and has no other needs at this time. Holli Humbles, RN

## 2019-11-18 NOTE — Evaluation (Signed)
Physical Therapy Evaluation Patient Details Name: Emily Avery MRN: [id number] DOB: [birth year] Today's Date: 11/18/2019   History of Present Illness  Emily Avery is a 59 year old white female s/p L4-5 decompression, instrumentation and fusion on the patient on 11/17/2019. PMH includes but not limited to anxiety, arthritis, bipolar depression, GERD, hyperlipidemia, hypertension, and osteoporosis.     Clinical Impression  Pt admitted with above diagnosis. At the time of PT eval, pt was able to demonstrate transfers and ambulation with gross supervision for safety to min guard assist and no AD. Pt was educated on precautions, brace application/wearing schedule, appropriate activity progression, and car transfer. Pt currently with functional limitations due to the deficits listed below (see PT Problem List). Pt will benefit from skilled PT to increase their independence and safety with mobility to allow discharge to the venue listed below.      Follow Up Recommendations No PT follow up;Supervision for mobility/OOB    Equipment Recommendations  None recommended by PT    Recommendations for Other Services       Precautions / Restrictions Precautions Precautions: Back Precaution Booklet Issued: Yes (comment) Precaution Comments: Reviewed precautions during functional mobility.  Required Braces or Orthoses: Spinal Brace Spinal Brace: Lumbar corset;Applied in sitting position Restrictions Weight Bearing Restrictions: No      Mobility  Bed Mobility Overal bed mobility: Needs Assistance Bed Mobility: Rolling;Sit to Sidelying Rolling: Modified independent (Device/Increase time) Sidelying to sit: Supervision     Sit to sidelying: Supervision General bed mobility comments: VC's for sequencing and improved log roll technique. HOB flat and rails lowered to simulate home environment.     Transfers Overall transfer level: Needs assistance Equipment used: None Transfers:  Sit to/from Stand Sit to Stand: Supervision         General transfer comment: Close supervision for safety. No unsteadiness or LOB noted however pt required increased time to achieve full transfer.   Ambulation/Gait Ambulation/Gait assistance: Supervision;Min guard Gait Distance (Feet): 250 Feet Assistive device: None Gait Pattern/deviations: Step-through pattern;Decreased stride length;Trunk flexed Gait velocity: Decreased Gait velocity interpretation: <1.31 ft/sec, indicative of household ambulator General Gait Details: VC's for improved posture. Min guard assist provided during turns as she appeared slightly unsteady, however grossly at a supervision level.   Stairs Stairs: Yes Stairs assistance: Min assist;Mod assist Stair Management: No rails;Step to pattern;Forwards (HHA) Number of Stairs: 2 General stair comments: HHA provided on the right to simulate home environment.   Wheelchair Mobility    Modified Rankin (Stroke Patients Only)       Balance Overall balance assessment: Needs assistance Sitting-balance support: No upper extremity supported;Feet supported Sitting balance-Leahy Scale: Fair     Standing balance support: No upper extremity supported;During functional activity Standing balance-Leahy Scale: Fair                               Pertinent Vitals/Pain Pain Assessment: Faces Faces Pain Scale: Hurts a little bit Pain Location: Incision site Pain Descriptors / Indicators: Grimacing;Operative site guarding Pain Intervention(s): Limited activity within patient's tolerance;Monitored during session;Repositioned    Home Living Family/patient expects to be discharged to:: Private residence Living Arrangements: Alone Available Help at Discharge: Family Type of Home: House Home Access: Stairs to enter Entrance Stairs-Rails: Can reach both Entrance Stairs-Number of Steps: 2-3 Home Layout: One level Home Equipment: None Additional Comments: Pt  will be d/c to her mom's house for the initial week. Mom's house has 2-3 steps  to enter, one level house with a tub shower and handicap height toilet. Her mom has a RW available for pt to use, mom will be able to provide setup assistance for pt    Prior Function Level of Independence: Independent         Comments: pt was living alone, working, and independent with all ADL/IADL     Hand Dominance   Dominant Hand: Right    Extremity/Trunk Assessment   Upper Extremity Assessment Upper Extremity Assessment: Defer to OT evaluation    Lower Extremity Assessment Lower Extremity Assessment: Generalized weakness (Consistent with pre-op diagnosis)    Cervical / Trunk Assessment Cervical / Trunk Assessment: Other exceptions Cervical / Trunk Exceptions: s/p surgery  Communication   Communication: No difficulties  Cognition Arousal/Alertness: Awake/alert Behavior During Therapy: WFL for tasks assessed/performed Overall Cognitive Status: Within Functional Limits for tasks assessed                                 General Comments: pt appeared "cloudy" during session, began to apply deodorant with cap, but able to correct herself and made a comment how medication affects her. Pt oriented x4, demonstrated good adherence to back precautions       General Comments General comments (skin integrity, edema, etc.): VSS, incision site honeycomb dressing intact    Exercises     Assessment/Plan    PT Assessment Patient needs continued PT services  PT Problem List Decreased strength;Decreased activity tolerance;Decreased balance;Decreased mobility;Decreased knowledge of use of DME;Decreased safety awareness;Decreased knowledge of precautions;Pain       PT Treatment Interventions DME instruction;Gait training;Stair training;Functional mobility training;Therapeutic activities;Therapeutic exercise;Neuromuscular re-education;Patient/family education    PT Goals (Current goals can  be found in the Care Plan section)  Acute Rehab PT Goals Patient Stated Goal: to go home to her mom's house PT Goal Formulation: With patient Time For Goal Achievement: 11/25/19 Potential to Achieve Goals: Good    Frequency Min 5X/week   Barriers to discharge        Co-evaluation               AM-PAC PT "6 Clicks" Mobility  Outcome Measure Help needed turning from your back to your side while in a flat bed without using bedrails?: None Help needed moving from lying on your back to sitting on the side of a flat bed without using bedrails?: None Help needed moving to and from a bed to a chair (including a wheelchair)?: None Help needed standing up from a chair using your arms (e.g., wheelchair or bedside chair)?: None Help needed to walk in hospital room?: A Little Help needed climbing 3-5 steps with a railing? : A Little 6 Click Score: 22    End of Session Equipment Utilized During Treatment: Gait belt;Back brace Activity Tolerance: Patient tolerated treatment well Patient left: in bed;with call bell/phone within reach Nurse Communication: Mobility status PT Visit Diagnosis: Unsteadiness on feet (R26.81);Pain Pain - part of body:  (incision site)    Time: 1497-0263 PT Time Calculation (min) (ACUTE ONLY): 25 min   Charges:   PT Evaluation $PT Eval Low Complexity: 1 Low PT Treatments $Gait Training: 8-22 mins        Rolinda Roan, PT, DPT Acute Rehabilitation Services Pager: [phone] Office: [phone]   Thelma Comp 11/18/2019, 11:00 AM

## 2019-11-18 NOTE — Anesthesia Postprocedure Evaluation (Signed)
Anesthesia Post Note  Patient: Emily Avery  Procedure(s) Performed: POSTERIOR LUMBAR INTERBODY FUSION, INTERBODY PROSTHESIS, POSTERIOR INSTRUMENTATION L four-L five (N/A Back)     Patient location during evaluation: PACU Anesthesia Type: General Level of consciousness: awake and alert Pain management: pain level controlled Vital Signs Assessment: post-procedure vital signs reviewed and stable Respiratory status: spontaneous breathing, nonlabored ventilation, respiratory function stable and patient connected to nasal cannula oxygen Cardiovascular status: blood pressure returned to baseline and stable Postop Assessment: no apparent nausea or vomiting Anesthetic complications: no   No complications documented.  Last Vitals:  Vitals:   11/17/19 2128 11/18/19 0318  BP: 138/74 (!) 143/78  Pulse: 79 79  Resp: 20 18  Temp: 36.6 C 36.9 C  SpO2: 94% 98%    Last Pain:  Vitals:   11/18/19 0529  TempSrc:   PainSc: 6                  Tiajuana Amass

## 2019-11-18 NOTE — Discharge Summary (Signed)
Physician Discharge Summary  Patient ID: Emily Avery MRN: [id number] DOB/AGE: January 12, 1961 59 y.o.  Admit date: 11/17/2019 Discharge date: 11/18/2019  Admission Diagnoses: L4-5 spondylolisthesis, facet arthropathy, spinal stenosis, lumbago, lumbar radiculopathy, neurogenic claudication  Discharge Diagnoses: The same Active Problems:   Spondylolisthesis of lumbar region   Discharged Condition: good  Hospital Course: I performed an L4-5 decompression, instrumentation and fusion on the patient on 11/17/2019.  The surgery went well.  The patient's postoperative course was unremarkable.  On postoperative day #1 she requested discharge home.  She was given written and oral discharge instructions.  All her questions were answered.  Consults: PT, OT, care management Significant Diagnostic Studies: None Treatments: L4-5 decompression, instrumentation and fusion. Discharge Exam: Blood pressure (!) 117/91, pulse 100, temperature 98 F (36.7 C), temperature source Oral, resp. rate 16, height 5\' 1"  (1.549 m), weight 85.7 kg, last menstrual period 04/22/2012, SpO2 96 %. The patient is alert and pleasant.  She looks well.  Her dressing is clean and dry.  Her strength is normal.  Disposition: Home  Discharge Instructions    Call MD for:  difficulty breathing, headache or visual disturbances   Complete by: As directed    Call MD for:  extreme fatigue   Complete by: As directed    Call MD for:  hives   Complete by: As directed    Call MD for:  persistant dizziness or light-headedness   Complete by: As directed    Call MD for:  persistant nausea and vomiting   Complete by: As directed    Call MD for:  redness, tenderness, or signs of infection (pain, swelling, redness, odor or green/yellow discharge around incision site)   Complete by: As directed    Call MD for:  severe uncontrolled pain   Complete by: As directed    Call MD for:  temperature >100.4   Complete by: As directed     Diet - low sodium heart healthy   Complete by: As directed    Discharge instructions   Complete by: As directed    Call [phone] for a followup appointment. Take a stool softener while you are using pain medications.   Driving Restrictions   Complete by: As directed    Do not drive for 2 weeks.   Increase activity slowly   Complete by: As directed    Lifting restrictions   Complete by: As directed    Do not lift more than 5 pounds. No excessive bending or twisting.   May shower / Bathe   Complete by: As directed    Remove the dressing for 3 days after surgery.  You may shower, but leave the incision alone.   Remove dressing in 48 hours   Complete by: As directed      Allergies as of 11/18/2019      Reactions   Codeine Nausea And Vomiting   Lisinopril Cough      Medication List    STOP taking these medications   acetaminophen 500 MG tablet Commonly known as: TYLENOL   alprazolam 2 MG tablet Commonly known as: XANAX   HYDROcodone-acetaminophen 5-325 MG tablet Commonly known as: NORCO/VICODIN   meloxicam 15 MG tablet Commonly known as: MOBIC     TAKE these medications   albuterol 108 (90 Base) MCG/ACT inhaler Commonly known as: VENTOLIN HFA Inhale 2 puffs into the lungs every 4 (four) hours as needed for wheezing or shortness of breath.   atorvastatin 20 MG tablet Commonly known as: LIPITOR  TAKE 1 TABLET EVERY DAY   cyclobenzaprine 10 MG tablet Commonly known as: FLEXERIL TAKE ONE TABLET BY MOUTH THREE TIMES DAILY AS NEEDED FOR MUSCLE SPASMS What changed:   how much to take  how to take this  when to take this  reasons to take this  additional instructions   docusate sodium 100 MG capsule Commonly known as: COLACE Take 1 capsule (100 mg total) by mouth 2 (two) times daily.   Imodium A-D 2 MG tablet Generic drug: loperamide Take 4 mg by mouth 4 (four) times daily as needed for diarrhea or loose stools.   lamoTRIgine 100 MG tablet Commonly  known as: LAMICTAL TAKE 2 TABLETS EVERY DAY   losartan 50 MG tablet Commonly known as: COZAAR Take 1 tablet (50 mg total) by mouth daily.   methylphenidate 20 MG 24 hr capsule Commonly known as: Ritalin LA Take 1 capsule (20 mg total) by mouth every morning.   oxybutynin 5 MG tablet Commonly known as: DITROPAN Take 2 tablets (10 mg total) by mouth 2 (two) times daily.   oxyCODONE-acetaminophen 5-325 MG tablet Commonly known as: PERCOCET/ROXICET Take 1-2 tablets by mouth every 4 (four) hours as needed for moderate pain.   sertraline 100 MG tablet Commonly known as: ZOLOFT TAKE 1 TABLET BY MOUTH TWICE DAILY   traZODone 100 MG tablet Commonly known as: DESYREL Take 2 tablets (200 mg total) by mouth at bedtime.        Signed: Ophelia Charter 11/18/2019, 7:43 AM

## 2019-11-18 NOTE — Discharge Instructions (Signed)

## 2019-11-19 ENCOUNTER — Other Ambulatory Visit: Payer: Self-pay | Admitting: Family Medicine

## 2019-11-19 MED FILL — Sodium Chloride IV Soln 0.9%: INTRAVENOUS | Qty: 1000 | Status: AC

## 2019-11-19 MED FILL — Heparin Sodium (Porcine) Inj 1000 Unit/ML: INTRAMUSCULAR | Qty: 30 | Status: AC

## 2019-12-10 ENCOUNTER — Encounter: Payer: Self-pay | Admitting: Gastroenterology

## 2019-12-23 DIAGNOSIS — M4316 Spondylolisthesis, lumbar region: Secondary | ICD-10-CM | POA: Diagnosis not present

## 2020-01-22 ENCOUNTER — Other Ambulatory Visit: Payer: Self-pay | Admitting: Family Medicine

## 2020-01-28 NOTE — Telephone Encounter (Signed)
Last office visit- 09/24/2019 Last labs- TSH, LIPID- 09/24/2019

## 2020-01-29 ENCOUNTER — Other Ambulatory Visit: Payer: Self-pay | Admitting: *Deleted

## 2020-01-29 MED ORDER — SERTRALINE HCL 100 MG PO TABS
100.0000 mg | ORAL_TABLET | Freq: Two times a day (BID) | ORAL | 0 refills | Status: DC
Start: 2020-01-29 — End: 2020-06-04

## 2020-01-29 MED ORDER — ATORVASTATIN CALCIUM 20 MG PO TABS
20.0000 mg | ORAL_TABLET | Freq: Every day | ORAL | 0 refills | Status: DC
Start: 2020-01-29 — End: 2020-03-01

## 2020-01-29 MED ORDER — OXYBUTYNIN CHLORIDE 5 MG PO TABS
10.0000 mg | ORAL_TABLET | Freq: Two times a day (BID) | ORAL | 0 refills | Status: DC
Start: 2020-01-29 — End: 2020-03-01

## 2020-01-29 NOTE — Telephone Encounter (Signed)
Rx done. 

## 2020-02-05 ENCOUNTER — Encounter: Payer: BC Managed Care – PPO | Admitting: Gastroenterology

## 2020-02-16 ENCOUNTER — Other Ambulatory Visit: Payer: Self-pay

## 2020-02-16 ENCOUNTER — Ambulatory Visit (AMBULATORY_SURGERY_CENTER): Payer: BC Managed Care – PPO | Admitting: *Deleted

## 2020-02-16 VITALS — Ht 61.75 in | Wt 185.0 lb

## 2020-02-16 DIAGNOSIS — Z1211 Encounter for screening for malignant neoplasm of colon: Secondary | ICD-10-CM

## 2020-02-16 MED ORDER — PEG 3350-KCL-NA BICARB-NACL 420 G PO SOLR: 4000.0000 mL | Freq: Once | ORAL | 0 refills | Status: AC

## 2020-02-16 NOTE — Progress Notes (Signed)
Pt verified name, DOB, address and insurance during PV today. Pt mailed instruction packet to included paper to complete and mail back to [facility] with addressed and stamped envelope, Emmi video, copy of consent form to read and not return, and instructions.  PV completed over the phone. Pt encouraged to call with questions or issues   No egg or soy allergy known to patient  No issues with past sedation with any surgeries or procedures No intubation problems in the past  No FH of Malignant Hyperthermia No diet pills per patient No home 02 use per patient  No blood thinners per patient  Pt denies issues with constipation  No A fib or A flutter  EMMI video to pt or via MyChart  COVID 19 guidelines implemented in PV today with Pt and RN  Pt is fully vaccinated  for Covid   Pt given the option in PV today for Golytely prep verses  alternative prep  ( Suprep/Plenvu)-  Pt is aware the Golytely has more volume but is more cost effective and the Suprep/Plenvu is less volume but may cost $90-150.  Pt voiced understanding of this and choose Golytely  Prep.  Due to the COVID-19 pandemic we are asking patients to follow certain guidelines.  Pt aware of COVID protocols and [facility] guidelines - pt states she may have a prep from last year at home and if it's in date asked if she could use it- instructed yes - to call with name and we could sed her My Chart instructions but we need to know name of prep for correct instructions

## 2020-02-17 ENCOUNTER — Other Ambulatory Visit: Payer: Self-pay | Admitting: Family Medicine

## 2020-02-24 ENCOUNTER — Encounter: Payer: Self-pay | Admitting: Gastroenterology

## 2020-02-25 ENCOUNTER — Other Ambulatory Visit: Payer: Self-pay | Admitting: Family Medicine

## 2020-02-25 ENCOUNTER — Encounter: Payer: Self-pay | Admitting: Family Medicine

## 2020-02-25 MED ORDER — METHYLPHENIDATE HCL ER (LA) 20 MG PO CP24: 20.0000 mg | ORAL_CAPSULE | ORAL | 0 refills | Status: DC

## 2020-02-25 NOTE — Telephone Encounter (Signed)
Tell her that we do NOT send any texts to patients, but some pharmacies are now using this system to inform patients when they fill a prescription

## 2020-02-25 NOTE — Telephone Encounter (Signed)
Requesting: Ritalin Contract: 02/18/2013 UDS: none  Last Visit: 09/24/2019 Next Visit: 03/01/2020 Last Refill: 11/12/2019  Please Advise

## 2020-02-25 NOTE — Telephone Encounter (Signed)
Pt was made aware.  

## 2020-02-25 NOTE — Telephone Encounter (Signed)
Done

## 2020-03-01 ENCOUNTER — Encounter: Payer: Self-pay | Admitting: Gastroenterology

## 2020-03-01 ENCOUNTER — Other Ambulatory Visit: Payer: Self-pay

## 2020-03-01 ENCOUNTER — Ambulatory Visit (AMBULATORY_SURGERY_CENTER): Payer: BC Managed Care – PPO | Admitting: Gastroenterology

## 2020-03-01 VITALS — BP 151/86 | HR 65 | Temp 98.3°F | Resp 18 | Ht 61.0 in | Wt 185.0 lb

## 2020-03-01 DIAGNOSIS — D125 Benign neoplasm of sigmoid colon: Secondary | ICD-10-CM

## 2020-03-01 DIAGNOSIS — D126 Benign neoplasm of colon, unspecified: Secondary | ICD-10-CM | POA: Diagnosis not present

## 2020-03-01 DIAGNOSIS — D123 Benign neoplasm of transverse colon: Secondary | ICD-10-CM

## 2020-03-01 DIAGNOSIS — D127 Benign neoplasm of rectosigmoid junction: Secondary | ICD-10-CM | POA: Diagnosis not present

## 2020-03-01 DIAGNOSIS — Z1211 Encounter for screening for malignant neoplasm of colon: Secondary | ICD-10-CM | POA: Diagnosis not present

## 2020-03-01 DIAGNOSIS — D122 Benign neoplasm of ascending colon: Secondary | ICD-10-CM | POA: Diagnosis not present

## 2020-03-01 DIAGNOSIS — D128 Benign neoplasm of rectum: Secondary | ICD-10-CM

## 2020-03-01 HISTORY — PX: COLONOSCOPY: SHX174

## 2020-03-01 MED ORDER — SODIUM CHLORIDE 0.9 % IV SOLN
500.0000 mL | Freq: Once | INTRAVENOUS | Status: DC
Start: 2020-03-01 — End: 2020-03-01

## 2020-03-01 NOTE — Patient Instructions (Signed)
Read all of the handouts given to you by your recovery room nurse.  You may resume your medications today.  YOU HAD AN ENDOSCOPIC PROCEDURE TODAY AT [facility]:   Refer to the procedure report that was given to you for any specific questions about what was found during the examination.  If the procedure report does not answer your questions, please call your gastroenterologist to clarify.  If you requested that your care partner not be given the details of your procedure findings, then the procedure report has been included in a sealed envelope for you to review at your convenience later.  YOU SHOULD EXPECT: Some feelings of bloating in the abdomen. Passage of more gas than usual.  Walking can help get rid of the air that was put into your GI tract during the procedure and reduce the bloating. If you had a lower endoscopy (such as a colonoscopy or flexible sigmoidoscopy) you may notice spotting of blood in your stool or on the toilet paper. If you underwent a bowel prep for your procedure, you may not have a normal bowel movement for a few days.  Please Note:  You might notice some irritation and congestion in your nose or some drainage.  This is from the oxygen used during your procedure.  There is no need for concern and it should clear up in a day or so.  SYMPTOMS TO REPORT IMMEDIATELY:   Following lower endoscopy (colonoscopy or flexible sigmoidoscopy):  Excessive amounts of blood in the stool  Significant tenderness or worsening of abdominal pains  Swelling of the abdomen that is new, acute  Fever of 100F or higher   For urgent or emergent issues, a gastroenterologist can be reached at any hour by calling [phone]. Do not use MyChart messaging for urgent concerns.    DIET:  We do recommend a small meal at first, but then you may proceed to your regular diet.  Drink plenty of fluids but you should avoid alcoholic beverages for 24 hours. Try to increase the fiber in  your diet, and drink plenty of water.  ACTIVITY:  You should plan to take it easy for the rest of today and you should NOT DRIVE or use heavy machinery until tomorrow (because of the sedation medicines used during the test).    FOLLOW UP: Our staff will call the number listed on your records 48-72 hours following your procedure to check on you and address any questions or concerns that you may have regarding the information given to you following your procedure. If we do not reach you, we will leave a message.  We will attempt to reach you two times.  During this call, we will ask if you have developed any symptoms of COVID 19. If you develop any symptoms (ie: fever, flu-like symptoms, shortness of breath, cough etc.) before then, please call [phone].  If you test positive for Covid 19 in the 2 weeks post procedure, please call and report this information to [place].    If any biopsies were taken you will be contacted by phone or by letter within the next 1-3 weeks.  Please call us at [phone] if you have not heard about the biopsies in 3 weeks.    SIGNATURES/CONFIDENTIALITY: You and/or your care partner have signed paperwork which will be entered into your electronic medical record.  These signatures attest to the fact that that the information above on your After Visit Summary has been reviewed and is understood.  Full responsibility of the confidentiality of this discharge information lies with you and/or your care-partner.

## 2020-03-01 NOTE — Progress Notes (Signed)
Report to PACU, RN, vss, BBS= Clear.  

## 2020-03-01 NOTE — Op Note (Signed)
[facility] Patient Name: Emily Avery Procedure Date: 03/01/2020 1:58 PM MRN: [id number] Endoscopist: Meryl Dare , MD Age: 60 Referring MD:  Date of Birth: 1960/05/22 Gender: Female Account #: [id number] Procedure:                Colonoscopy Indications:              Screening for colorectal malignant neoplasm Medicines:                Monitored Anesthesia Care Procedure:                Pre-Anesthesia Assessment:                           - Prior to the procedure, a History and Physical                            was performed, and patient medications and                            allergies were reviewed. The patient's tolerance of                            previous anesthesia was also reviewed. The risks                            and benefits of the procedure and the sedation                            options and risks were discussed with the patient.                            All questions were answered, and informed consent                            was obtained. Prior Anticoagulants: The patient has                            taken no previous anticoagulant or antiplatelet                            agents. ASA Grade Assessment: II - A patient with                            mild systemic disease. After reviewing the risks                            and benefits, the patient was deemed in                            satisfactory condition to undergo the procedure.                           After obtaining informed consent, the colonoscope  was passed under direct vision. Throughout the                            procedure, the patient's blood pressure, pulse, and                            oxygen saturations were monitored continuously. The                            Olympus PCF-[id number] (#[id number]) Colonoscope was                            introduced through the anus and advanced to the the                            cecum,  identified by appendiceal orifice and                            ileocecal valve. The ileocecal valve, appendiceal                            orifice, and rectum were photographed. The quality                            of the bowel preparation was good. The colonoscopy                            was performed without difficulty. The patient                            tolerated the procedure well. Scope In: 2:06:16 PM Scope Out: 2:33:36 PM Scope Withdrawal Time: 0 hours 22 minutes 20 seconds  Total Procedure Duration: 0 hours 27 minutes 20 seconds  Findings:                 The perianal and digital rectal examinations were                            normal.                           Eight sessile polyps were found in the rectum (2),                            sigmoid colon (2), transverse colon (1), hepatic                            flexure (2) and ascending colon (1). The polyps                            were 6 to 8 mm in size. These polyps were removed                            with a cold snare. Resection and retrieval were  complete.                           Multiple small-mouthed diverticula were found in                            the left colon.                           The exam was otherwise without abnormality on                            direct and retroflexion views. Complications:            No immediate complications. Estimated blood loss:                            None. Estimated Blood Loss:     Estimated blood loss: none. Impression:               - Eight 6 to 8 mm polyps in the rectum, in the                            sigmoid colon, in the transverse colon, at the                            hepatic flexure and in the ascending colon, removed                            with a cold snare. Resected and retrieved.                           - Mild diverticulosis in the left colon.                           - The examination was otherwise  normal on direct                            and retroflexion views. Recommendation:           - Repeat colonoscopy, likely 3 years, after studies                            are complete for surveillance based on pathology                            results.                           - Patient has a contact number available for                            emergencies. The signs and symptoms of potential                            delayed complications were discussed with the  patient. Return to normal activities tomorrow.                            Written discharge instructions were provided to the                            patient.                           - High fiber diet.                           - Continue present medications.                           - Await pathology results. Ladene Artist, MD 03/01/2020 2:38:33 PM This report has been signed electronically.

## 2020-03-01 NOTE — Progress Notes (Signed)
Called to room to assist during endoscopic procedure.  Patient ID and intended procedure confirmed with present staff. Received instructions for my participation in the procedure from the performing physician.  

## 2020-03-01 NOTE — Progress Notes (Addendum)
Jb - Check-in  CW - VS  Pt's states no medical or surgical changes since previsit or office visit.  Pt has her Albuterol Inhaler at the bedside with label of name attached.

## 2020-03-02 IMAGING — MG DIGITAL DIAGNOSTIC BILATERAL MAMMOGRAM WITH TOMO AND CAD
8 of 14 series · 8 of 40 positions shown · non-contrast
Comparison: Previous exam(s).

CLINICAL DATA: Patient describes a palpable lump within the lower
LEFT breast. Ordering physician's clinical data describes a possible
LEFT breast mass at the 1-2 o'clock axis near nipple.

EXAM:
DIGITAL DIAGNOSTIC BILATERAL MAMMOGRAM WITH CAD AND TOMO
ULTRASOUND LEFT BREAST

[R CC synth-2D]
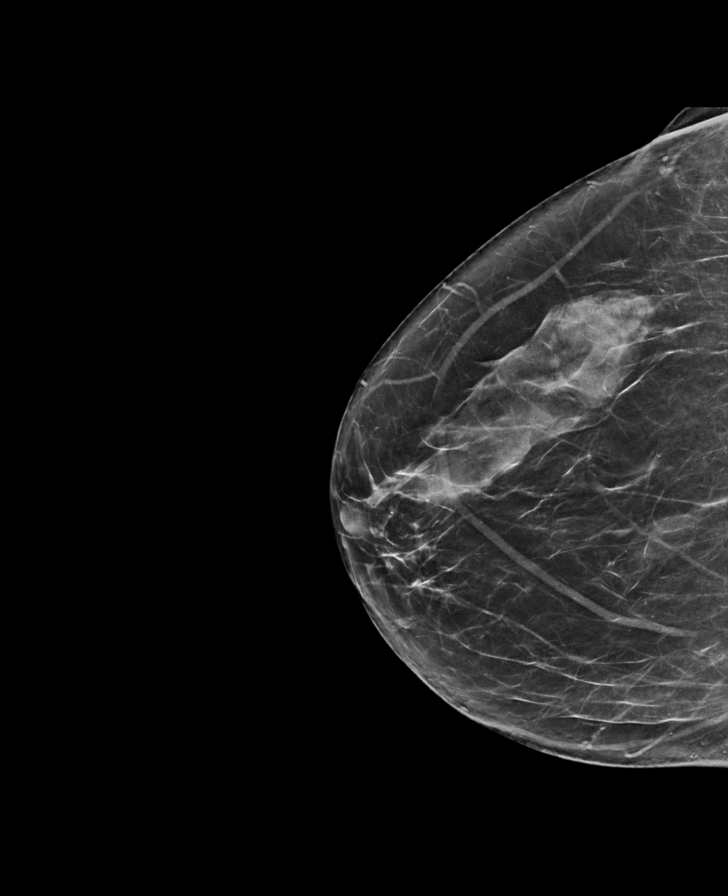

[R MLO synth-2D]
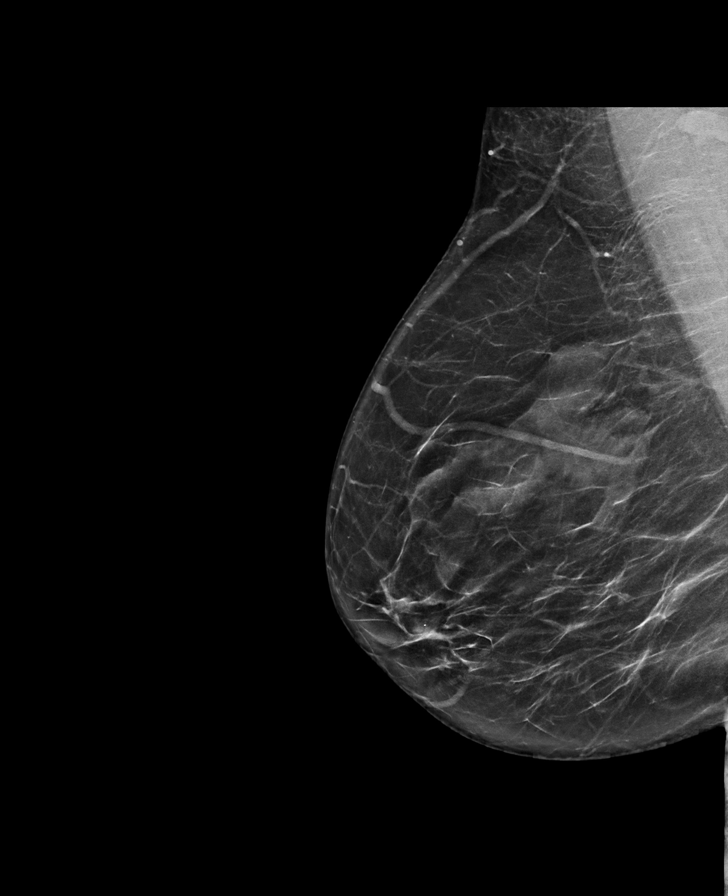

[L MLO synth-2D (1 of 3)]
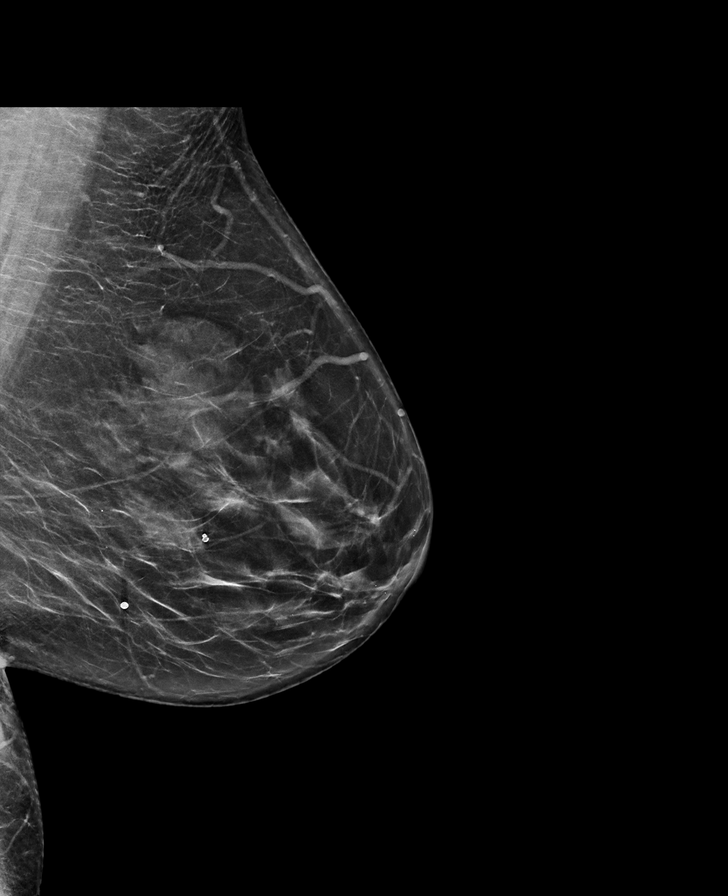

[L MLO synth-2D (2 of 3)]
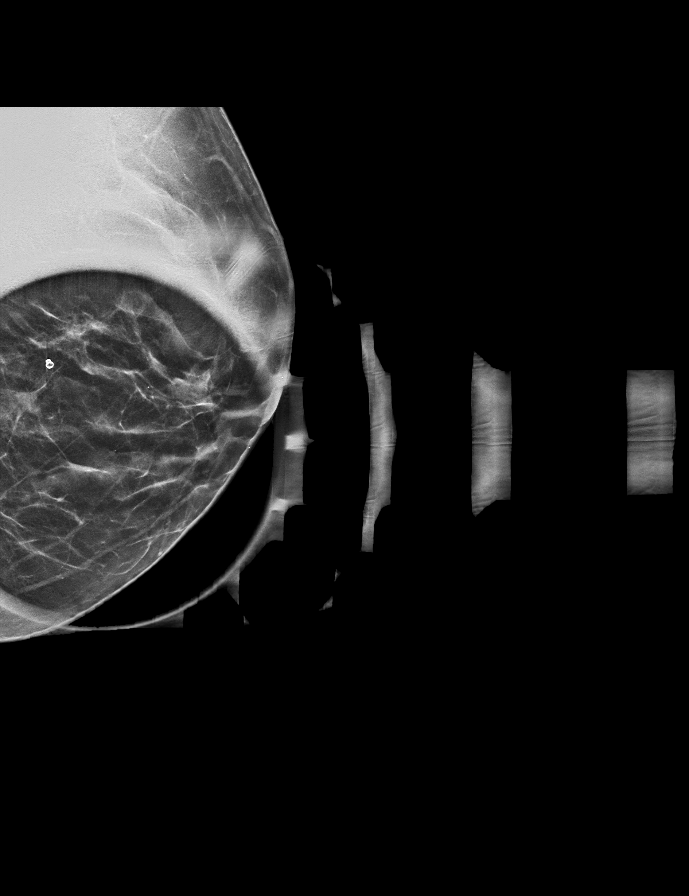

[L CC synth-2D]
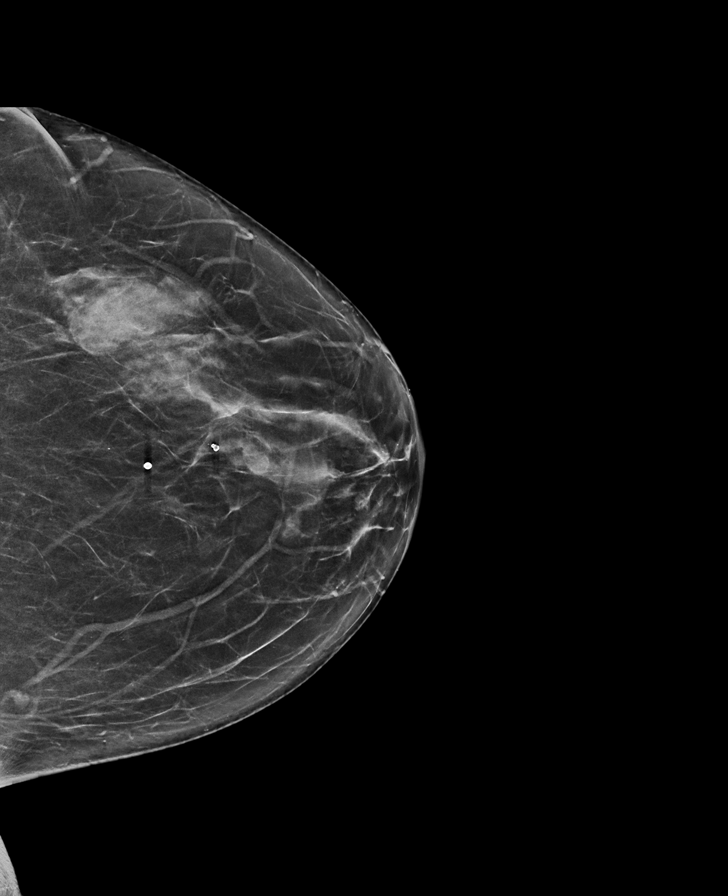

[L MLO synth-2D (3 of 3)]
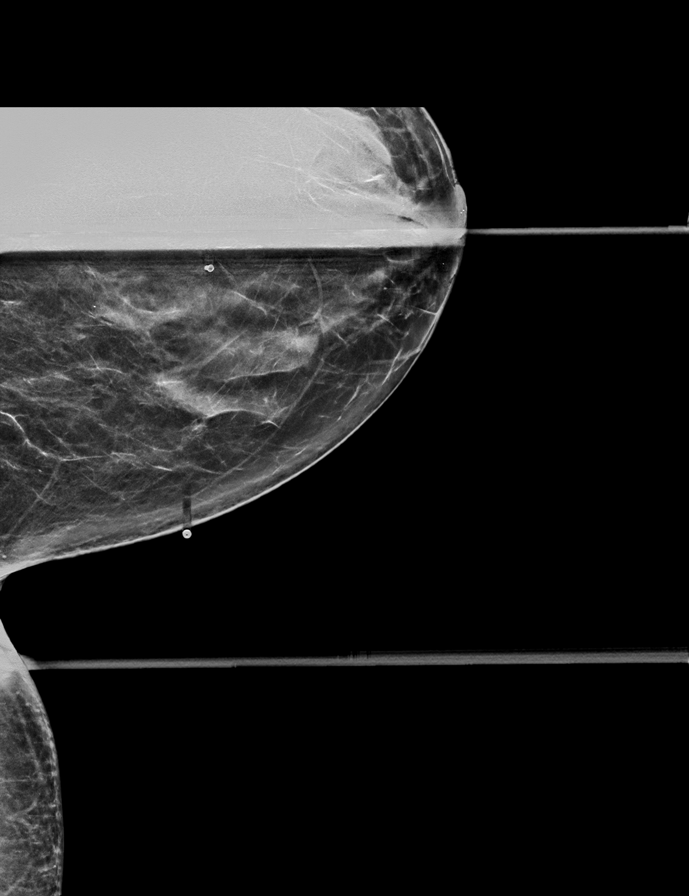

[L ML synth-2D]
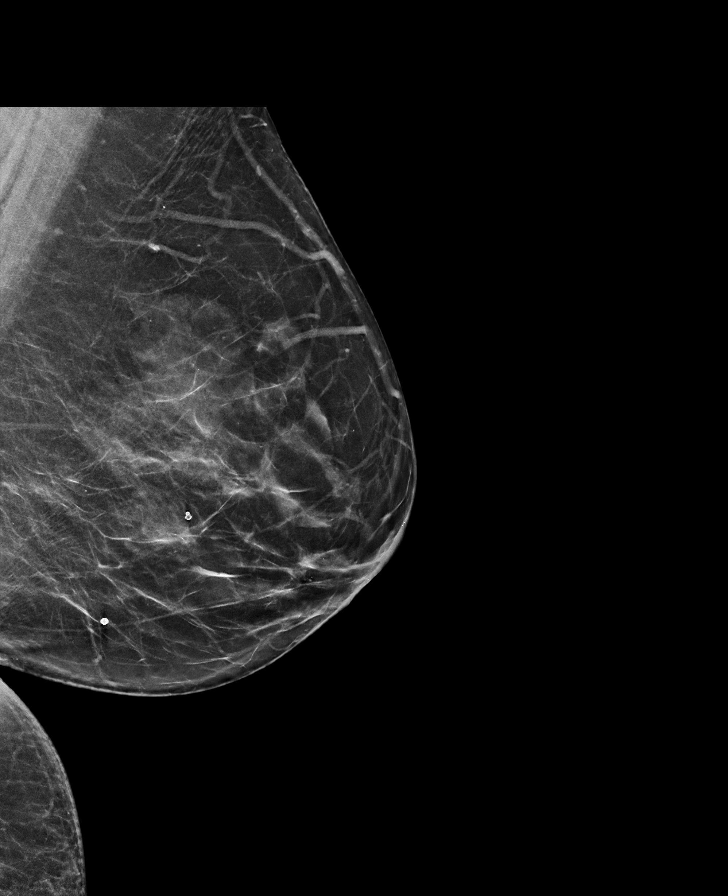

[R CC tomo · tomo slice 33/65.0]
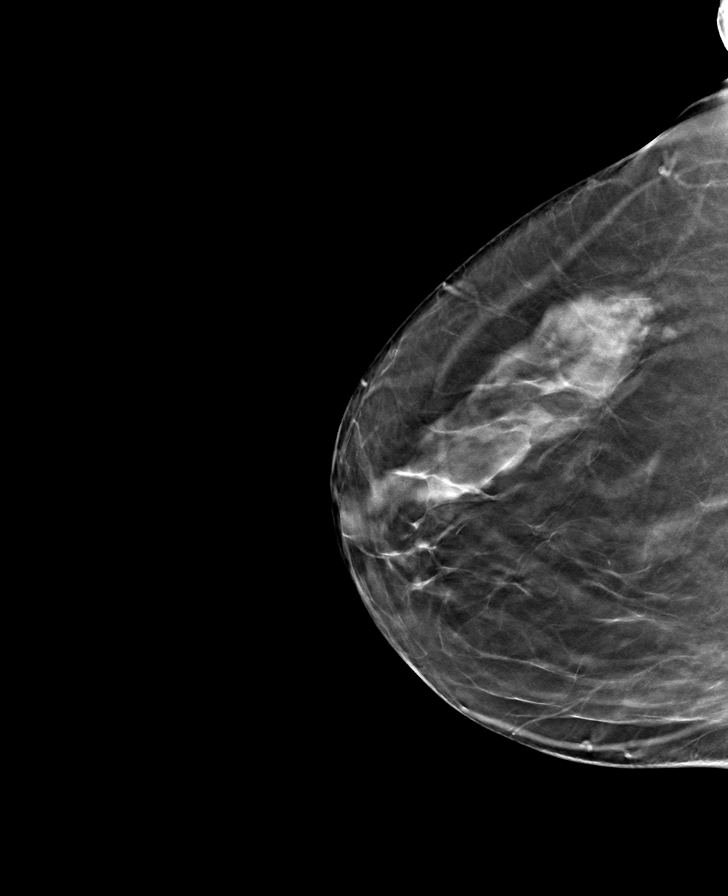

[8 of 40 positions shown; findings below may reference images not displayed]

ACR Breast Density Category b: There are scattered areas of
fibroglandular density.
FINDINGS: There are no new dominant masses, suspicious calcifications or
secondary signs of malignancy identified within either breast.
Specifically there is no mammographic abnormality within the lower
LEFT breast corresponding to the patient's area of clinical concern,
with overlying skin marker in place. Overall fibroglandular pattern
of the LEFT breast is stable dating back to 8016.

Mammographic images were processed with CAD.

Targeted ultrasound is performed, evaluating the lower outer
quadrant of the LEFT breast as directed by the patient, showing only
normal fibroglandular tissues and fat lobules. No solid or cystic
mass.

Targeted ultrasound is also performed of the retroareolar LEFT
breast, 1-2 o'clock axes, corresponding to the clinical data
provided by the ordering physician, showing a ridge of normal dense
fibroglandular tissue. No solid or cystic mass.
IMPRESSION: No evidence of malignancy within either breast.

RECOMMENDATION:
1.  Screening mammogram in one year.(Code:FG-8-GZQ)
2. The patient was instructed to return sooner if the area that she
feels becomes larger and/or firmer to palpation, or if a new
palpable abnormality is identified in either breast.

I have discussed the findings and recommendations with the patient.
Results were also provided in writing at the conclusion of the
visit. If applicable, a reminder letter will be sent to the patient
regarding the next appointment.

BI-RADS CATEGORY  1: Negative.

## 2020-03-03 ENCOUNTER — Telehealth: Payer: Self-pay | Admitting: *Deleted

## 2020-03-03 NOTE — Telephone Encounter (Signed)
  Follow up Call-  Call back number 03/01/2020  Post procedure Call Back phone  # [phone] cell  Permission to leave phone message Yes  Some recent data might be hidden     Patient questions:  Do you have a fever, pain , or abdominal swelling? No. Pain Score  0 *  Have you tolerated food without any problems? Yes.    Have you been able to return to your normal activities? Yes.    Do you have any questions about your discharge instructions: Diet   No. Medications  No. Follow up visit  No.  Do you have questions or concerns about your Care? No.  Actions: * If pain score is 4 or above: No action needed, pain <4.  1. Have you developed a fever since your procedure? no  2.   Have you had an respiratory symptoms (SOB or cough) since your procedure? no  3.   Have you tested positive for COVID 19 since your procedure no  4.   Have you had any family members/close contacts diagnosed with the COVID 19 since your procedure?  no   If yes to any of these questions please route to Joylene John, RN and Joella Prince, RN

## 2020-03-08 ENCOUNTER — Telehealth: Payer: Self-pay | Admitting: Family Medicine

## 2020-03-08 ENCOUNTER — Telehealth (INDEPENDENT_AMBULATORY_CARE_PROVIDER_SITE_OTHER): Payer: BC Managed Care – PPO | Admitting: Family Medicine

## 2020-03-08 ENCOUNTER — Encounter: Payer: Self-pay | Admitting: Family Medicine

## 2020-03-08 VITALS — Ht 61.0 in | Wt 185.0 lb

## 2020-03-08 DIAGNOSIS — J40 Bronchitis, not specified as acute or chronic: Secondary | ICD-10-CM

## 2020-03-08 MED ORDER — HYDROCOD POLST-CPM POLST ER 10-8 MG/5ML PO SUER: 5.0000 mL | Freq: Two times a day (BID) | ORAL | 0 refills | Status: DC | PRN

## 2020-03-08 MED ORDER — ALPRAZOLAM 2 MG PO TABS: 2.0000 mg | ORAL_TABLET | Freq: Two times a day (BID) | ORAL | 1 refills | Status: DC | PRN

## 2020-03-08 MED ORDER — AZITHROMYCIN 250 MG PO TABS: ORAL_TABLET | ORAL | 0 refills | Status: DC

## 2020-03-08 NOTE — Telephone Encounter (Signed)
rx was sent at the time of VV today. Dm/cma

## 2020-03-08 NOTE — Progress Notes (Signed)
Subjective:    Patient ID: Emily Avery, female    DOB: 28-Jul-1960, 60 y.o.   MRN: [id number]  HPI Virtual Visit via Telephone Note  I connected with the patient on 03/08/20 at 10:15 AM EST by telephone and verified that I am speaking with the correct person using two identifiers.   I discussed the limitations, risks, security and privacy concerns of performing an evaluation and management service by telephone and the availability of in person appointments. I also discussed with the patient that there may be a patient responsible charge related to this service. The patient expressed understanding and agreed to proceed.  Location patient: home Location provider: work or home office Participants present for the call: patient, provider Patient did not have a visit in the prior 7 days to address this/these issue(s).   History of Present Illness: Virtual Visit via Telephone Note  I connected with the patient on 03/08/20 at 10:15 AM EST by telephone and verified that I am speaking with the correct person using two identifiers.   I discussed the limitations, risks, security and privacy concerns of performing an evaluation and management service by telephone and the availability of in person appointments. I also discussed with the patient that there may be a patient responsible charge related to this service. The patient expressed understanding and agreed to proceed.  Location patient: home Location provider: work or home office Participants present for the call: patient, provider Patient did not have a visit in the prior 7 days to address this/these issue(s).   History of Present Illness: Here for one week of chest congestion and a dry cough. No fever. No SOB or chest pain. No body aches or NVD. Using Mucinex and her inhaler.    Observations/Objective: Patient sounds cheerful and well on the phone. I do not appreciate any SOB. Speech and thought processing are grossly  intact. Patient reported vitals:  Assessment and Plan: Bronchitis, treat with a Zpack. I advised her to be tested for the Covid virus as well.  Alysia Penna, MD   Follow Up Instructions:     [phone] 5-10 [phone] 11-20 [phone] 21-30 I did not refer this patient for an OV in the next 24 hours for this/these issue(s).  I discussed the assessment and treatment plan with the patient. The patient was provided an opportunity to ask questions and all were answered. The patient agreed with the plan and demonstrated an understanding of the instructions.   The patient was advised to call back or seek an in-person evaluation if the symptoms worsen or if the condition fails to improve as anticipated.  I provided 11 minutes of non-face-to-face time during this encounter.   Alysia Penna, MD    Observations/Objective: Patient sounds cheerful and well on the phone. I do not appreciate any SOB. Speech and thought processing are grossly intact. Patient reported vitals:  Assessment and Plan:   Follow Up Instructions:     [id number] [phone] [phone] 65-68 1275 21-30 I did not refer this patient for an OV in the next 24 hours for this/these issue(s).  I discussed the assessment and treatment plan with the patient. The patient was provided an opportunity to ask questions and all were answered. The patient agreed with the plan and demonstrated an understanding of the instructions.   The patient was advised to call back or seek an in-person evaluation if the symptoms worsen or if the condition fails to improve as anticipated.  I provided 56minutes of non-face-to-face time during  this encounter.   Alysia Penna, MD    Review of Systems     Objective:   Physical Exam        Assessment & Plan:

## 2020-03-08 NOTE — Telephone Encounter (Signed)
Pt call and stated she need a refill on  alprazolam [name]) 2 MG tablet sent to  [facility], [place] AT [address] Phone:  [phone]  Fax:  [phone]

## 2020-03-18 ENCOUNTER — Other Ambulatory Visit: Payer: Self-pay

## 2020-03-25 ENCOUNTER — Encounter: Payer: Self-pay | Admitting: Gastroenterology

## 2020-04-13 DIAGNOSIS — M542 Cervicalgia: Secondary | ICD-10-CM | POA: Diagnosis not present

## 2020-04-13 DIAGNOSIS — M4316 Spondylolisthesis, lumbar region: Secondary | ICD-10-CM | POA: Diagnosis not present

## 2020-04-13 DIAGNOSIS — Z981 Arthrodesis status: Secondary | ICD-10-CM | POA: Diagnosis not present

## 2020-04-13 DIAGNOSIS — M4722 Other spondylosis with radiculopathy, cervical region: Secondary | ICD-10-CM | POA: Diagnosis not present

## 2020-04-15 ENCOUNTER — Telehealth: Payer: Self-pay | Admitting: Family Medicine

## 2020-04-15 NOTE — Telephone Encounter (Signed)
Patient dropped off a form to be filled out by Dr. Sarajane Jews.   States to call [phone] when completed.   The form was placed in the folder up front.

## 2020-04-16 NOTE — Telephone Encounter (Signed)
Pt form for Wellness Examination is complete and pt was notified to pick up at the office. Form placed at the front office cabinet and a copy sent to scanning

## 2020-05-13 ENCOUNTER — Telehealth: Payer: Self-pay | Admitting: Family Medicine

## 2020-05-13 NOTE — Telephone Encounter (Signed)
Last refill- 04/24/2020-30 tabs with no refills Last office visit- 03/08/2020  No future appointment scheduled

## 2020-05-13 NOTE — Telephone Encounter (Signed)
Patient needs refills on Methylphenidate.  Please call pt once they are called in.   Pharmacy- [facility] on [address].

## 2020-05-21 ENCOUNTER — Other Ambulatory Visit: Payer: Self-pay | Admitting: Family Medicine

## 2020-06-04 ENCOUNTER — Other Ambulatory Visit: Payer: Self-pay | Admitting: Family Medicine

## 2020-07-19 ENCOUNTER — Telehealth: Payer: Self-pay | Admitting: Family Medicine

## 2020-07-19 NOTE — Telephone Encounter (Signed)
Last refill- 04/24/20-30 capsules, 0 refill Last office visit- 03/08/20  No future office visit scheduled

## 2020-07-19 NOTE — Telephone Encounter (Signed)
Patient calling in requesting refill for: methylphenidate (RITALIN LA) 20 MG 24 hr capsule    Send to: [facility] [place], [address] AT [address]  [place],  [place] [address]  Phone:  [phone]  Fax:  [phone]

## 2020-07-20 MED ORDER — METHYLPHENIDATE HCL ER (LA) 20 MG PO CP24: 20.0000 mg | ORAL_CAPSULE | ORAL | 0 refills | Status: DC

## 2020-07-20 NOTE — Telephone Encounter (Signed)
Sent a message to pt through [place]

## 2020-07-20 NOTE — Telephone Encounter (Signed)
Done

## 2020-07-24 ENCOUNTER — Other Ambulatory Visit: Payer: Self-pay | Admitting: Family Medicine

## 2020-08-20 ENCOUNTER — Telehealth: Payer: Self-pay | Admitting: Family Medicine

## 2020-08-20 DIAGNOSIS — Z20822 Contact with and (suspected) exposure to covid-19: Secondary | ICD-10-CM | POA: Diagnosis not present

## 2020-08-20 NOTE — Telephone Encounter (Signed)
FYI pt called to let us know that she tested positive today (08/20/2020) for COVID at [facility] on [address] and pt declined to have a virtual appointment due to her being over the rough part of the virus stated that she has a lingering cough.

## 2020-09-02 ENCOUNTER — Other Ambulatory Visit: Payer: Self-pay | Admitting: Family Medicine

## 2020-09-02 NOTE — Telephone Encounter (Signed)
Last video visit- 03/08/20 Last refill- 03/08/20--180 tabs, 1 refill  No future visit scheduled

## 2020-09-22 ENCOUNTER — Telehealth: Payer: Self-pay | Admitting: Family Medicine

## 2020-09-22 NOTE — Telephone Encounter (Signed)
Pt Last visit was telephone call on 03/08/2020 Last refill done on 09/19/20 for 30 tablets Please advise

## 2020-09-22 NOTE — Telephone Encounter (Signed)
TELEPHONE ENCOUNTER PRIMARY CARE PROVIDER: Laurey Morale, MD  REASON FOR CALL:  pt requesting a medication refill on methylphenidate (RITALIN LA) 20 MG 24 hr capsule [facility] [place], [address] AT [address]  Phone: [phone] Fax:  [phone]  LAST VISIT IN THIS CLINIC:  09/02/2020 NEXT APPOINTMENT SCHEDULED IN THIS CLINIC: Visit date not found

## 2020-09-23 NOTE — Telephone Encounter (Signed)
Spoke with pt verbalized understanding that she has a refill left for Ritalin at her pharmacy

## 2020-09-23 NOTE — Telephone Encounter (Signed)
She already has refills to last until 10-19-20

## 2020-11-25 ENCOUNTER — Other Ambulatory Visit: Payer: Self-pay | Admitting: Family Medicine

## 2020-12-01 ENCOUNTER — Telehealth: Payer: Self-pay | Admitting: Family Medicine

## 2020-12-01 ENCOUNTER — Other Ambulatory Visit: Payer: Self-pay | Admitting: Family Medicine

## 2020-12-01 NOTE — Telephone Encounter (Signed)
Patient is requesting for something to be prescribed for her bronchitis.   Patient could be contacted at [phone].  Please advise.

## 2020-12-01 NOTE — Telephone Encounter (Signed)
Please advise if video visit is advise.   I will reach out to patient and schedule appointment.

## 2020-12-02 NOTE — Telephone Encounter (Signed)
She will need an OV for this  

## 2020-12-02 NOTE — Telephone Encounter (Signed)
Called patient message given, phone called dropped.

## 2020-12-03 ENCOUNTER — Other Ambulatory Visit: Payer: Self-pay

## 2020-12-13 ENCOUNTER — Telehealth: Payer: Self-pay

## 2020-12-13 MED ORDER — METHYLPHENIDATE HCL ER (LA) 20 MG PO CP24: 20.0000 mg | ORAL_CAPSULE | ORAL | 0 refills | Status: DC

## 2020-12-13 NOTE — Telephone Encounter (Signed)
Dr. Sarajane Jews patient.   Last refill- 09/19/20--30 caps, 0 refills Last video visit- 03/08/20  No future office visit scheduled.  Can this patient receive a refill?

## 2020-12-13 NOTE — Telephone Encounter (Signed)
Patient called requesting Rx refill  methylphenidate (RITALIN LA) 20 MG 24 hr capsule

## 2020-12-13 NOTE — Addendum Note (Signed)
Addended by: Eulas Post on: 12/13/2020 12:46 PM   Modules accepted: Orders

## 2021-02-21 ENCOUNTER — Other Ambulatory Visit: Payer: Self-pay | Admitting: Family Medicine

## 2021-02-21 ENCOUNTER — Other Ambulatory Visit: Payer: Self-pay | Admitting: Adult Health

## 2021-02-21 NOTE — Telephone Encounter (Signed)
Pt Last visit was MyChart Video visit on 03/08/2020 Last refill done on 09/03/2020 Please advise

## 2021-02-22 NOTE — Telephone Encounter (Signed)
Message routed to PCP CMA  

## 2021-02-24 ENCOUNTER — Other Ambulatory Visit: Payer: Self-pay

## 2021-02-24 ENCOUNTER — Other Ambulatory Visit: Payer: Self-pay | Admitting: Adult Health

## 2021-02-24 ENCOUNTER — Other Ambulatory Visit: Payer: Self-pay | Admitting: Family Medicine

## 2021-02-24 ENCOUNTER — Telehealth: Payer: Self-pay | Admitting: Family Medicine

## 2021-02-24 MED ORDER — TRAZODONE HCL 100 MG PO TABS: ORAL_TABLET | ORAL | 5 refills | Status: DC

## 2021-02-24 NOTE — Telephone Encounter (Signed)
Last OV- 09/24/2019 Last refill- 12/13/20--30 capsules, 0 refills   Next med check OV- 03/01/2021.  Can this patient receive a refill?

## 2021-02-24 NOTE — Telephone Encounter (Signed)
Last OV 09/24/2019 Next med check OV- 03/01/21  Can patient receive a refill?

## 2021-02-24 NOTE — Telephone Encounter (Signed)
Pt LOV was on 03/08/2020 Last refill done by [facility] on 11/25/2020 by Tommi Rumps  Please advise if ok for refill

## 2021-02-24 NOTE — Telephone Encounter (Signed)
Done

## 2021-02-24 NOTE — Addendum Note (Signed)
Addended by: Alysia Penna A on: 02/24/2021 12:48 PM   Modules accepted: Orders

## 2021-02-24 NOTE — Telephone Encounter (Signed)
I refilled the Trazodone but not the Ritalin. We will do that at her OV

## 2021-02-24 NOTE — Telephone Encounter (Signed)
Patient is requesting a medication refill for methylphenidate (RITALIN LA) 20 MG 24 hr capsule [[id number]] to be sent to her pharmacy.  Patient hasn't been in office since 09/24/2019 so I scheduled her an appointment for 03/01/21.  Patient could be contacted at [phone].  Please advise.

## 2021-02-24 NOTE — Telephone Encounter (Signed)
Called patient message given. 

## 2021-02-24 NOTE — Telephone Encounter (Signed)
Pt last OV- 09/24/2019.     Next med check OV- 03/01/2021.     Can patient receive refill now or refilled at next OV?

## 2021-03-01 ENCOUNTER — Encounter: Payer: Self-pay | Admitting: Family Medicine

## 2021-03-01 ENCOUNTER — Ambulatory Visit (INDEPENDENT_AMBULATORY_CARE_PROVIDER_SITE_OTHER): Payer: BC Managed Care – PPO | Admitting: Family Medicine

## 2021-03-01 VITALS — BP 124/80 | HR 80 | Temp 97.9°F | Wt 184.0 lb

## 2021-03-01 DIAGNOSIS — M479 Spondylosis, unspecified: Secondary | ICD-10-CM

## 2021-03-01 DIAGNOSIS — F900 Attention-deficit hyperactivity disorder, predominantly inattentive type: Secondary | ICD-10-CM | POA: Diagnosis not present

## 2021-03-01 DIAGNOSIS — I10 Essential (primary) hypertension: Secondary | ICD-10-CM

## 2021-03-01 DIAGNOSIS — J40 Bronchitis, not specified as acute or chronic: Secondary | ICD-10-CM | POA: Diagnosis not present

## 2021-03-01 DIAGNOSIS — F324 Major depressive disorder, single episode, in partial remission: Secondary | ICD-10-CM | POA: Diagnosis not present

## 2021-03-01 DIAGNOSIS — N3281 Overactive bladder: Secondary | ICD-10-CM

## 2021-03-01 MED ORDER — SERTRALINE HCL 100 MG PO TABS: ORAL_TABLET | ORAL | 3 refills | Status: DC

## 2021-03-01 MED ORDER — NAPROXEN 500 MG PO TABS: 500.0000 mg | ORAL_TABLET | Freq: Two times a day (BID) | ORAL | 3 refills | Status: DC

## 2021-03-01 MED ORDER — METHYLPHENIDATE HCL ER (LA) 20 MG PO CP24: 20.0000 mg | ORAL_CAPSULE | ORAL | 0 refills | Status: DC

## 2021-03-01 MED ORDER — HYDROCOD POLI-CHLORPHE POLI ER 10-8 MG/5ML PO SUER: 5.0000 mL | Freq: Two times a day (BID) | ORAL | 0 refills | Status: DC | PRN

## 2021-03-01 MED ORDER — ALBUTEROL SULFATE HFA 108 (90 BASE) MCG/ACT IN AERS: 2.0000 | INHALATION_SPRAY | RESPIRATORY_TRACT | 5 refills | Status: AC | PRN

## 2021-03-01 MED ORDER — ATORVASTATIN CALCIUM 20 MG PO TABS: 20.0000 mg | ORAL_TABLET | Freq: Every day | ORAL | 3 refills | Status: DC

## 2021-03-01 MED ORDER — CYCLOBENZAPRINE HCL 10 MG PO TABS: ORAL_TABLET | ORAL | 5 refills | Status: AC

## 2021-03-01 MED ORDER — LOSARTAN POTASSIUM 50 MG PO TABS: ORAL_TABLET | ORAL | 3 refills | Status: DC

## 2021-03-01 MED ORDER — TRAZODONE HCL 100 MG PO TABS
ORAL_TABLET | ORAL | 5 refills | Status: DC
Start: 2021-03-01 — End: 2022-03-22

## 2021-03-01 MED ORDER — OXYBUTYNIN CHLORIDE 5 MG PO TABS: ORAL_TABLET | ORAL | 0 refills | Status: DC

## 2021-03-01 MED ORDER — LAMOTRIGINE 100 MG PO TABS: 200.0000 mg | ORAL_TABLET | Freq: Every day | ORAL | 3 refills | Status: DC

## 2021-03-01 NOTE — Progress Notes (Signed)
° °  Subjective:    Patient ID: Emily Avery, female    DOB: 1960/04/27, 61 y.o.   MRN: [id number]  HPI Here for medication refills and some problems. Her depression and ADHD are stable. Her OA has been unresponsive to Meloxicam, so she wants to go back on Naproxen. Also she has had a cough for 3 months. This is usually dry but may produce yellow sputum. No fever or SOB.    Review of Systems  Constitutional: Negative.   Respiratory:  Positive for cough and chest tightness. Negative for shortness of breath and wheezing.   Cardiovascular: Negative.   Gastrointestinal: Negative.   Musculoskeletal:  Positive for back pain.      Objective:   Physical Exam Constitutional:      Appearance: Normal appearance. She is not ill-appearing.  Cardiovascular:     Rate and Rhythm: Normal rate and regular rhythm.     Pulses: Normal pulses.     Heart sounds: Normal heart sounds.  Pulmonary:     Effort: Pulmonary effort is normal.     Breath sounds: Normal breath sounds.  Lymphadenopathy:     Cervical: No cervical adenopathy.  Neurological:     General: No focal deficit present.     Mental Status: She is alert and oriented to person, place, and time.  Psychiatric:        Mood and Affect: Mood normal.        Behavior: Behavior normal.        Thought Content: Thought content normal.          Assessment & Plan:  She has a bronchitis and we will treat this with a Zpack. For the OA we will stop Meloxicam and go back to Naproxen 500 mg BID. Her ADHD and depression are stable so medications were refilled. Her HTN is stable. We spent a total of ( 32  ) minutes reviewing records and discussing these issues.  Alysia Penna, MD

## 2021-03-03 ENCOUNTER — Other Ambulatory Visit: Payer: Self-pay | Admitting: Family Medicine

## 2021-05-26 ENCOUNTER — Other Ambulatory Visit: Payer: Self-pay | Admitting: Family Medicine

## 2021-05-26 DIAGNOSIS — N3281 Overactive bladder: Secondary | ICD-10-CM

## 2021-06-29 ENCOUNTER — Other Ambulatory Visit: Payer: Self-pay | Admitting: Family Medicine

## 2021-06-29 DIAGNOSIS — N3281 Overactive bladder: Secondary | ICD-10-CM

## 2021-08-25 ENCOUNTER — Other Ambulatory Visit: Payer: Self-pay | Admitting: Family Medicine

## 2021-08-26 NOTE — Telephone Encounter (Signed)
Last OV- 03/01/21 Last refill- 02/21/21---180 tabs, 1 refill  No future OV scheduled.

## 2021-09-02 ENCOUNTER — Other Ambulatory Visit: Payer: Self-pay | Admitting: Family Medicine

## 2021-09-02 DIAGNOSIS — N3281 Overactive bladder: Secondary | ICD-10-CM

## 2021-10-06 ENCOUNTER — Telehealth: Payer: Self-pay | Admitting: Licensed Clinical Social Worker

## 2021-10-13 ENCOUNTER — Encounter: Payer: Self-pay | Admitting: Licensed Clinical Social Worker

## 2021-10-18 NOTE — Patient Outreach (Signed)
  Care Coordination   Initial Visit Note   10/18/2021 Name: KAYA POTTENGER MRN: [id number] DOB: 10-29-1960  MERSADEZ LINDEN is a 61 y.o. year old female who sees Laurey Morale, MD for primary care. I spoke with  Vida Rigger by phone today.  What matters to the patients health and wellness today?  Care Coordination    Goals Addressed             This Visit's Progress    Care Coordination-LCSW Plan of Care   On track    Care Coordination Interventions: Active listening / Reflection utilized  Emotional Support Provided LCSW informed patient of care coordination services. Pt is interested in RN and LCSW f/up. Message sent to [facility] Pt was scheduled for initial         SDOH assessments and interventions completed:  No     Care Coordination Interventions Activated:  Yes  Care Coordination Interventions:  Yes, provided   Follow up plan: Follow up call scheduled for 10/13/21 10:45 AM    Encounter Outcome:  Pt. Visit Completed   Christa See, MSW, [email or web address] Phone [phone] 8:30 AM

## 2021-10-18 NOTE — Patient Instructions (Signed)
Visit Information  Thank you for taking time to visit with me today. Please don't hesitate to contact me if I can be of assistance to you.   Following are the goals we discussed today:   Goals Addressed             This Visit's Progress    Care Coordination-LCSW Plan of Care   On track    Care Coordination Interventions: Active listening / Reflection utilized  Emotional Support Provided LCSW informed patient of care coordination services. Pt is interested in RN and LCSW f/up. Message sent to [facility] Pt was scheduled for initial         Our next appointment is by telephone on 10/13/21 at 10:45 AM  Please call the care guide team at [phone] if you need to cancel or reschedule your appointment.   If you are experiencing a Mental Health or [place] or need someone to talk to, please call the Suicide and Crisis Lifeline: 988 call 911   Patient verbalizes understanding of instructions and care plan provided today and agrees to view in [employer]. Active MyChart status and patient understanding of how to access instructions and care plan via MyChart confirmed with patient.     Christa See, MSW, [email or web address] Phone [phone] 8:30 AM

## 2021-10-27 ENCOUNTER — Other Ambulatory Visit: Payer: Self-pay | Admitting: Family Medicine

## 2021-11-18 ENCOUNTER — Other Ambulatory Visit: Payer: Self-pay | Admitting: Family Medicine

## 2021-11-18 DIAGNOSIS — N3281 Overactive bladder: Secondary | ICD-10-CM

## 2021-11-29 ENCOUNTER — Encounter: Payer: Self-pay | Admitting: Family Medicine

## 2021-11-29 ENCOUNTER — Ambulatory Visit (INDEPENDENT_AMBULATORY_CARE_PROVIDER_SITE_OTHER): Payer: BC Managed Care – PPO | Admitting: Family Medicine

## 2021-11-29 ENCOUNTER — Ambulatory Visit (INDEPENDENT_AMBULATORY_CARE_PROVIDER_SITE_OTHER): Payer: BC Managed Care – PPO

## 2021-11-29 VITALS — BP 128/78 | HR 72 | Temp 97.6°F | Ht 60.0 in | Wt 194.0 lb

## 2021-11-29 DIAGNOSIS — R059 Cough, unspecified: Secondary | ICD-10-CM | POA: Diagnosis not present

## 2021-11-29 DIAGNOSIS — Z136 Encounter for screening for cardiovascular disorders: Secondary | ICD-10-CM

## 2021-11-29 DIAGNOSIS — R9389 Abnormal findings on diagnostic imaging of other specified body structures: Secondary | ICD-10-CM

## 2021-11-29 DIAGNOSIS — Z0000 Encounter for general adult medical examination without abnormal findings: Secondary | ICD-10-CM

## 2021-11-29 MED ORDER — METHYLPHENIDATE HCL ER (LA) 20 MG PO CP24: 20.0000 mg | ORAL_CAPSULE | ORAL | 0 refills | Status: DC

## 2021-11-29 MED ORDER — MELOXICAM 15 MG PO TABS: 15.0000 mg | ORAL_TABLET | Freq: Every day | ORAL | 3 refills | Status: DC

## 2021-11-29 MED ORDER — SOLIFENACIN SUCCINATE 10 MG PO TABS: 10.0000 mg | ORAL_TABLET | Freq: Every day | ORAL | 3 refills | Status: DC

## 2021-11-29 NOTE — Progress Notes (Signed)
Subjective:    Patient ID: Emily Avery, female    DOB: [birth year], 61 y.o.   MRN: [id number]  HPI Here for a well exam, but first she wanted to talk about the recent death of her younger sister. Her sister, who had no history of heart problems, died of a sudden heart attack 2 weeks ago. This has been very difficult for Emily Avery, and she has been struggling with grief. She is still taking her usual medications, including Xanax several times a day. She has had trouble sleeping and eating. She does not want to change any of her medications for depression or anxiety however. After this happened she wants to make sure she is as healthy as she can be. Unfortunately she still smokes. Otherwise she wants to stop the Naproxen because it upsets her stomach, so she needs an alternative. She also wants to try seomthing stronger than Oxybutynin to help with bladder leakage.    Review of Systems  Constitutional: Negative.   HENT: Negative.    Eyes: Negative.   Respiratory: Negative.    Cardiovascular: Negative.   Gastrointestinal:  Positive for constipation.  Genitourinary:  Negative for decreased urine volume, difficulty urinating, dyspareunia, dysuria, enuresis, flank pain, frequency, hematuria, pelvic pain and urgency.  Musculoskeletal: Negative.   Skin: Negative.   Neurological: Negative.  Negative for headaches.  Psychiatric/Behavioral:  Positive for dysphoric mood and sleep disturbance. Negative for hallucinations, self-injury and suicidal ideas. The patient is nervous/anxious.        Objective:   Physical Exam Constitutional:      General: She is not in acute distress.    Appearance: She is well-developed. She is obese.  HENT:     Head: Normocephalic and atraumatic.     Right Ear: External ear normal.     Left Ear: External ear normal.     Nose: Nose normal.     Mouth/Throat:     Pharynx: No oropharyngeal exudate.  Eyes:     General: No scleral icterus.    Conjunctiva/sclera:  Conjunctivae normal.     Pupils: Pupils are equal, round, and reactive to light.  Neck:     Thyroid: No thyromegaly.     Vascular: No JVD.  Cardiovascular:     Rate and Rhythm: Normal rate and regular rhythm.     Heart sounds: Normal heart sounds. No murmur heard.    No friction rub. No gallop.  Pulmonary:     Effort: Pulmonary effort is normal. No respiratory distress.     Breath sounds: Normal breath sounds. No wheezing or rales.  Chest:     Chest wall: No tenderness.  Abdominal:     General: Bowel sounds are normal. There is no distension.     Palpations: Abdomen is soft. There is no mass.     Tenderness: There is no abdominal tenderness. There is no guarding or rebound.  Musculoskeletal:        General: No tenderness. Normal range of motion.     Cervical back: Normal range of motion and neck supple.  Lymphadenopathy:     Cervical: No cervical adenopathy.  Skin:    General: Skin is warm and dry.     Findings: No erythema or rash.  Neurological:     General: No focal deficit present.     Mental Status: She is alert and oriented to person, place, and time.     Cranial Nerves: No cranial nerve deficit.     Motor: No abnormal muscle tone.  Coordination: Coordination normal.     Deep Tendon Reflexes: Reflexes are normal and symmetric. Reflexes normal.  Psychiatric:        Behavior: Behavior normal.        Thought Content: Thought content normal.        Judgment: Judgment normal.     Comments: She is very upset and tearful            Assessment & Plan:  Well exam. We discussed diet and exercise. Get fasting labs. She  will try Meloxicam instead of Naproxyn for joint pains. She will try Vesicare instead of Oxybutynin for OAB. She is going through the grief process, and I suggested she begin counseling either through a therapist or her pastor. For health screening we will get a CXR today, and we will set up a cardiac calcium CT score sometime soon.  Alysia Penna,  MD

## 2021-12-05 ENCOUNTER — Telehealth: Payer: Self-pay | Admitting: Family Medicine

## 2021-12-05 NOTE — Addendum Note (Signed)
Addended by: Alysia Penna A on: 12/05/2021 07:53 AM   Modules accepted: Orders

## 2021-12-05 NOTE — Telephone Encounter (Signed)
Pt called to ask if she has to pay separately/out of pocket for the labs? Pt was informed that CMA would call her back with the answer. Pt stated she wanted a call back from the MD, and not the CMA.

## 2021-12-05 NOTE — Telephone Encounter (Signed)
Called patient, requested call back in morning on 12/06/21

## 2021-12-06 ENCOUNTER — Telehealth: Payer: Self-pay | Admitting: Family Medicine

## 2021-12-06 NOTE — Telephone Encounter (Addendum)
Spoke with patient concerning future lab orders that were placed on 11/29/21.

## 2021-12-06 NOTE — Telephone Encounter (Signed)
Patient is requesting another call back says she does not understand mychart and needs bloodwork done. Supposed to go to hospital for CT scan.

## 2021-12-07 NOTE — Telephone Encounter (Signed)
error 

## 2021-12-07 NOTE — Telephone Encounter (Signed)
Spoke with pt scheduled for la app for 12/20/21 ant 10 am, aware to fast

## 2021-12-20 ENCOUNTER — Telehealth: Payer: Self-pay | Admitting: Family Medicine

## 2021-12-20 ENCOUNTER — Other Ambulatory Visit (INDEPENDENT_AMBULATORY_CARE_PROVIDER_SITE_OTHER): Payer: BC Managed Care – PPO

## 2021-12-20 ENCOUNTER — Ambulatory Visit (INDEPENDENT_AMBULATORY_CARE_PROVIDER_SITE_OTHER): Payer: BC Managed Care – PPO

## 2021-12-20 DIAGNOSIS — R9389 Abnormal findings on diagnostic imaging of other specified body structures: Secondary | ICD-10-CM

## 2021-12-20 DIAGNOSIS — R918 Other nonspecific abnormal finding of lung field: Secondary | ICD-10-CM | POA: Diagnosis not present

## 2021-12-20 DIAGNOSIS — Z0000 Encounter for general adult medical examination without abnormal findings: Secondary | ICD-10-CM

## 2021-12-20 LAB — BASIC METABOLIC PANEL
BUN: 20 mg/dL (ref 6–23)
CO2: 28 mEq/L (ref 19–32)
Calcium: 9.1 mg/dL (ref 8.4–10.5)
Chloride: 103 mEq/L (ref 96–112)
Creatinine, Ser: 0.64 mg/dL (ref 0.40–1.20)
GFR: 96.64 mL/min (ref >60.00)
Glucose, Bld: 94 mg/dL (ref 70–99)
Potassium: 3.9 mEq/L (ref 3.5–5.1)
Sodium: 139 mEq/L (ref 135–145)

## 2021-12-20 LAB — CBC WITH DIFFERENTIAL/PLATELET
Basophils Absolute: 0.1 10*3/uL (ref 0.0–0.1)
Basophils Relative: 0.6 % (ref 0.0–3.0)
Eosinophils Absolute: 0.1 10*3/uL (ref 0.0–0.7)
Eosinophils Relative: 0.8 % (ref 0.0–5.0)
HCT: 43.3 % (ref 36.0–46.0)
Hemoglobin: 14.6 g/dL (ref 12.0–15.0)
Lymphocytes Relative: 33.4 % (ref 12.0–46.0)
Lymphs Abs: 3.4 10*3/uL (ref 0.7–4.0)
MCHC: 33.7 g/dL (ref 30.0–36.0)
MCV: 92.3 fl (ref 78.0–100.0)
Monocytes Absolute: 0.7 10*3/uL (ref 0.1–1.0)
Monocytes Relative: 6.6 % (ref 3.0–12.0)
Neutro Abs: 5.9 10*3/uL (ref 1.4–7.7)
Neutrophils Relative %: 58.6 % (ref 43.0–77.0)
Platelets: 352 10*3/uL (ref 150.0–400.0)
RBC: 4.69 Mil/uL (ref 3.87–5.11)
RDW: 12.8 % (ref 11.5–15.5)
WBC: 10.1 10*3/uL (ref 4.0–10.5)

## 2021-12-20 LAB — HEPATIC FUNCTION PANEL
ALT: 14 U/L (ref 0–35)
AST: 15 U/L (ref 0–37)
Albumin: 4.3 g/dL (ref 3.5–5.2)
Alkaline Phosphatase: 102 U/L (ref 39–117)
Bilirubin, Direct: 0.1 mg/dL (ref 0.0–0.3)
Total Bilirubin: 0.3 mg/dL (ref 0.2–1.2)
Total Protein: 7.2 g/dL (ref 6.0–8.3)

## 2021-12-20 LAB — TSH: TSH: 2.43 u[IU]/mL (ref 0.35–5.50)

## 2021-12-20 LAB — LIPID PANEL
Cholesterol: 139 mg/dL (ref 0–200)
HDL: 33.7 mg/dL — ABNORMAL LOW (ref >39.00)
LDL Cholesterol: 83 mg/dL (ref 0–99)
NonHDL: 105.75
Total CHOL/HDL Ratio: 4
Triglycerides: 113 mg/dL (ref 0.0–149.0)
VLDL: 22.6 mg/dL (ref 0.0–40.0)

## 2021-12-20 LAB — HEMOGLOBIN A1C: Hgb A1c MFr Bld: 6 % (ref 4.6–6.5)

## 2021-12-20 NOTE — Telephone Encounter (Signed)
Patient has a question about one of the medications that Dr. Sarajane Jews changed.  She is requesting a call back.

## 2021-12-20 NOTE — Telephone Encounter (Signed)
Stop the Vesicare and call in Myrbetriq 50 mg daily, #30 with 2 rf

## 2021-12-20 NOTE — Telephone Encounter (Signed)
Called Emily Avery back state that the new Rx for Vesicare she is now taking is not helping. Requests for Dr Sarajane Jews to send  something else that will help.Please advise

## 2021-12-21 NOTE — Telephone Encounter (Signed)
Her CXR still shows an opacity in the right upper lung area. I ordered a chest CT to get a better look

## 2021-12-22 ENCOUNTER — Other Ambulatory Visit: Payer: Self-pay

## 2021-12-22 MED ORDER — MIRABEGRON ER 50 MG PO TB24: 50.0000 mg | ORAL_TABLET | Freq: Every day | ORAL | 2 refills | Status: DC

## 2021-12-22 NOTE — Telephone Encounter (Signed)
Pt notified and New Rx sent to her pharmacy

## 2021-12-29 ENCOUNTER — Ambulatory Visit (HOSPITAL_COMMUNITY): Payer: BC Managed Care – PPO

## 2021-12-29 ENCOUNTER — Ambulatory Visit (HOSPITAL_COMMUNITY): Admission: RE | Admit: 2021-12-29 | Payer: BC Managed Care – PPO | Source: Ambulatory Visit

## 2022-01-02 ENCOUNTER — Telehealth: Payer: Self-pay | Admitting: Family Medicine

## 2022-01-02 NOTE — Telephone Encounter (Signed)
Patient was sent to triage line for coughing, sore, throat and headache. Patient was given an ER outcome due to triage nurse hearing patient having SOB just speaking over the phone. Patient refused to go to ER. Patient stated that the symptoms all hit her this morning.     Please advise

## 2022-01-02 NOTE — Telephone Encounter (Signed)
Pt call back and stated she want Emily Avery or dr.Fry to give her a  call.

## 2022-01-02 NOTE — Telephone Encounter (Signed)
Please schedule her an OV for tomorrow

## 2022-01-02 NOTE — Telephone Encounter (Signed)
Caller states she has a cough,sore throat and headache and body aches started last night. Unable to check temp.  01/02/2022 8:42:26 AM Go to ED Now Yes Raenette Rover, RN, Zella Ball  Comments User: Wilson Singer, RN Date/Time Eilene Ghazi Time): 01/02/2022 8:49:03 AM Caller refused RN recommendation and states she is not going to the ER to sit there all day. Advised back line and they will send a message to the doctor and determine if they can still schedule her or not. Advised she refused the ER recommendation. That recommendation was due to the fact she sounds sob over the phone.  User: Wilson Singer, RN Date/Time Eilene Ghazi Time): 01/02/2022 8:52:03 AM Caller very upset over not getting an appointment. Advised her the process. That after the dr Gar Gibbon the note will determine whether to see. Caller disgrumbled ober it and wants to talk about how long the wait is in the ER and disappointmed there aren't any appointments. She actually doesn't sound as sob now that she is upset as she did during the assessment. Advised caller its best if she follow the recommendation but if not she can either go to UC or wait on her dr office call back. Caller still up set but advised I have to let her go and take other calls. She is welcome to call back anytime.

## 2022-01-02 NOTE — Telephone Encounter (Signed)
Lmom for pt to callback to sch an appt

## 2022-01-02 NOTE — Telephone Encounter (Signed)
Pt has been scheduled for tomorrow at 1.30 pm

## 2022-01-03 ENCOUNTER — Ambulatory Visit (INDEPENDENT_AMBULATORY_CARE_PROVIDER_SITE_OTHER): Payer: BC Managed Care – PPO | Admitting: Family Medicine

## 2022-01-03 ENCOUNTER — Encounter: Payer: Self-pay | Admitting: Family Medicine

## 2022-01-03 VITALS — BP 118/80 | HR 89 | Temp 99.5°F | Wt 195.0 lb

## 2022-01-03 DIAGNOSIS — N3281 Overactive bladder: Secondary | ICD-10-CM | POA: Diagnosis not present

## 2022-01-03 DIAGNOSIS — R509 Fever, unspecified: Secondary | ICD-10-CM | POA: Diagnosis not present

## 2022-01-03 DIAGNOSIS — J020 Streptococcal pharyngitis: Secondary | ICD-10-CM

## 2022-01-03 DIAGNOSIS — J101 Influenza due to other identified influenza virus with other respiratory manifestations: Secondary | ICD-10-CM

## 2022-01-03 LAB — POC COVID19 BINAXNOW: SARS Coronavirus 2 Ag: NEGATIVE

## 2022-01-03 LAB — POCT INFLUENZA A/B
Influenza A, POC: POSITIVE — AB
Influenza B, POC: NEGATIVE

## 2022-01-03 LAB — POCT RAPID STREP A (OFFICE): Rapid Strep A Screen: POSITIVE — AB

## 2022-01-03 MED ORDER — HYDROCOD POLI-CHLORPHE POLI ER 10-8 MG/5ML PO SUER: 5.0000 mL | Freq: Two times a day (BID) | ORAL | 0 refills | Status: DC | PRN

## 2022-01-03 MED ORDER — CEFUROXIME AXETIL 500 MG PO TABS: 500.0000 mg | ORAL_TABLET | Freq: Two times a day (BID) | ORAL | 0 refills | Status: AC

## 2022-01-03 NOTE — Progress Notes (Signed)
   Subjective:    Patient ID: Emily Avery, female    DOB: 1960-05-12, 61 y.o.   MRN: [id number]  HPI Here for 3 days of body aches, fever, ST, and coughing up yellow sputum. No NVD. Taking Nyquil. Otherwise she is still struggling with OAB, and she has frequent leakages of urine. She is taking Myrbetriq with little relief. She wants to see another urology group for a second opinion.    Review of Systems  Constitutional:  Positive for fever.  HENT:  Positive for congestion and sore throat. Negative for ear pain, postnasal drip and sinus pressure.   Eyes: Negative.   Respiratory:  Positive for cough and wheezing. Negative for shortness of breath.   Gastrointestinal: Negative.        Objective:   Physical Exam Constitutional:      Comments: She appears ill, coughing occasionally   HENT:     Right Ear: Tympanic membrane, ear canal and external ear normal.     Left Ear: Tympanic membrane, ear canal and external ear normal.     Nose: Nose normal.     Mouth/Throat:     Pharynx: Oropharynx is clear.  Eyes:     Conjunctiva/sclera: Conjunctivae normal.  Pulmonary:     Effort: Pulmonary effort is normal.     Breath sounds: Wheezing and rhonchi present. No rales.  Lymphadenopathy:     Cervical: No cervical adenopathy.  Neurological:     Mental Status: She is alert.           Assessment & Plan:  She has both strep pharyngitis and influenza A. Treat with 10 days of Cefuroxime. Use cough syrup as needed. Use albuterol inhaler as needed. She is written out of work this week. For the OAB, we will refer her to [facility]. Alysia Penna, MD

## 2022-01-19 ENCOUNTER — Ambulatory Visit (HOSPITAL_BASED_OUTPATIENT_CLINIC_OR_DEPARTMENT_OTHER)
Admission: RE | Admit: 2022-01-19 | Discharge: 2022-01-19 | Disposition: A | Discharge status: Home / Self Care | Payer: BC Managed Care – PPO | Source: Ambulatory Visit | Attending: Family Medicine | Admitting: Family Medicine

## 2022-01-19 DIAGNOSIS — Z136 Encounter for screening for cardiovascular disorders: Secondary | ICD-10-CM | POA: Insufficient documentation

## 2022-01-25 ENCOUNTER — Telehealth: Payer: Self-pay | Admitting: Family Medicine

## 2022-01-25 NOTE — Telephone Encounter (Signed)
Spoke with patient message given concerning Cardiac score results.

## 2022-01-25 NOTE — Telephone Encounter (Signed)
Pt is calling and would like ct cardiac score results

## 2022-01-31 NOTE — Telephone Encounter (Signed)
error 

## 2022-02-23 ENCOUNTER — Telehealth: Payer: Self-pay | Admitting: Family Medicine

## 2022-02-23 NOTE — Telephone Encounter (Signed)
[facility] form to be filled out--placed in Dr's folder.  Call [phone] upon completion.

## 2022-02-23 NOTE — Telephone Encounter (Signed)
Pt form received and placed in Dr Sarajane Jews red folder

## 2022-02-28 ENCOUNTER — Other Ambulatory Visit: Payer: Self-pay | Admitting: Family Medicine

## 2022-02-28 NOTE — Telephone Encounter (Signed)
Spoke with pt this morning aware that her form is complete and ready for pick up at the front office. Form placed in the front office cabinet

## 2022-03-01 NOTE — Telephone Encounter (Signed)
Last OV-01/03/22 Last refill-08/26/21--180 tabs, 1 refills  No future OV scheduled.

## 2022-03-18 ENCOUNTER — Other Ambulatory Visit: Payer: Self-pay | Admitting: Family Medicine

## 2022-03-22 NOTE — Telephone Encounter (Signed)
Last OV-01/03/22 Last refill- 03/01/21--60 tabs, 5 refills  No future OV scheduled.

## 2022-04-17 ENCOUNTER — Other Ambulatory Visit: Payer: Self-pay | Admitting: Family Medicine

## 2022-04-18 ENCOUNTER — Other Ambulatory Visit: Payer: Self-pay | Admitting: Family Medicine

## 2022-05-05 ENCOUNTER — Telehealth: Payer: Self-pay | Admitting: Family Medicine

## 2022-05-05 ENCOUNTER — Other Ambulatory Visit: Payer: Self-pay | Admitting: Family Medicine

## 2022-05-05 NOTE — Telephone Encounter (Signed)
Requested to speak with provider, would not divulge the nature of the call, said it's personal. Also requested 3 years of medical records, advised her to reach out to medical records to obtain

## 2022-05-10 NOTE — Telephone Encounter (Signed)
Spoke with pt regarding CT report, advised for further review to schedule app, pt is scheduled for 05/16/22

## 2022-05-16 ENCOUNTER — Encounter: Payer: Self-pay | Admitting: Family Medicine

## 2022-05-16 ENCOUNTER — Ambulatory Visit (INDEPENDENT_AMBULATORY_CARE_PROVIDER_SITE_OTHER): Payer: BC Managed Care – PPO | Admitting: Family Medicine

## 2022-05-16 VITALS — BP 118/76 | HR 83 | Temp 97.5°F | Wt 196.0 lb

## 2022-05-16 DIAGNOSIS — N3281 Overactive bladder: Secondary | ICD-10-CM | POA: Diagnosis not present

## 2022-05-16 NOTE — Progress Notes (Signed)
   Subjective:    Patient ID: Emily Avery, female    DOB: 09-28-1960, 62 y.o.   MRN: [id number]  HPI Here for several issues. First she had a CT cardiac scoring test on 01-19-22 that showed a score of 15, with negative chest fields. She asks [place] to go over these results and to interpret them. As we did so, I remembered that we obtained chest Xrays on 11-29-21 and on 12-20-21 to rule out pneumonia. These described a nodule in the upper lobe of the right lung which did not clear with antibiotics. I had placed an order for a contrasted chest CT to get a better look at this area on 12-21-21 which has not been done yet. She denies any cough or chest pain or SOB today. The other issue is her OAB. She has tried Oxybutynin, Vesicare, and Myrbetriq with no improvement. She saw Dr. Retta Diones at [facility] in 2-17, but she did not improve. She then saw a urologist at [facility] a few months ago ,and they did not have anything to offer either other than limit fluid intake and do Kegel exercises. She asks about getting another opinion.    Review of Systems  Constitutional: Negative.   Respiratory: Negative.    Cardiovascular: Negative.   Gastrointestinal: Negative.   Genitourinary:  Positive for frequency and urgency. Negative for dysuria, flank pain, hematuria and pelvic pain.       Objective:   Physical Exam Constitutional:      Appearance: Normal appearance.  Cardiovascular:     Rate and Rhythm: Normal rate and regular rhythm.     Pulses: Normal pulses.     Heart sounds: Normal heart sounds.  Pulmonary:     Effort: Pulmonary effort is normal.     Breath sounds: Normal breath sounds.  Abdominal:     General: Abdomen is flat. Bowel sounds are normal. There is no distension.     Palpations: Abdomen is soft. There is no mass.     Tenderness: There is no abdominal tenderness. There is no guarding or rebound.     Hernia: No hernia is present.  Neurological:     Mental Status: She is alert.            Assessment & Plan:  The cardiac calcium score test showed she is at very low risk for having CAD. We still need to get the contrasted chest CT however to evaluate the RUL nodule. My order is still in place, so she will contact [facility] to get this scheduled. As for the OAB, we will refer her to Dr. Kasandra Knudsen for another opinion.  Gershon Crane, MD

## 2022-05-17 ENCOUNTER — Other Ambulatory Visit: Payer: Self-pay | Admitting: Family Medicine

## 2022-08-06 ENCOUNTER — Other Ambulatory Visit: Payer: Self-pay | Admitting: Family Medicine

## 2022-08-31 ENCOUNTER — Other Ambulatory Visit: Payer: Self-pay | Admitting: Family Medicine

## 2022-09-01 NOTE — Telephone Encounter (Signed)
Pt LOV was on 05/16/22 Last refill done on 03/01/22 Please advise

## 2022-11-29 ENCOUNTER — Other Ambulatory Visit: Payer: Self-pay | Admitting: Family Medicine

## 2022-12-05 ENCOUNTER — Encounter: Payer: BC Managed Care – PPO | Admitting: Family Medicine

## 2022-12-15 ENCOUNTER — Other Ambulatory Visit: Payer: Self-pay | Admitting: Family Medicine

## 2022-12-27 ENCOUNTER — Encounter: Payer: BC Managed Care – PPO | Admitting: Family Medicine

## 2022-12-29 ENCOUNTER — Encounter: Payer: Self-pay | Admitting: Family Medicine

## 2022-12-29 NOTE — Telephone Encounter (Signed)
 Care team updated and letter sent for eye exam notes.

## 2023-01-03 ENCOUNTER — Ambulatory Visit (INDEPENDENT_AMBULATORY_CARE_PROVIDER_SITE_OTHER): Payer: BC Managed Care – PPO | Admitting: Family Medicine

## 2023-01-03 ENCOUNTER — Encounter: Payer: Self-pay | Admitting: Family Medicine

## 2023-01-03 VITALS — BP 128/78 | HR 80 | Temp 98.2°F | Ht 60.5 in | Wt 197.0 lb

## 2023-01-03 DIAGNOSIS — Z0000 Encounter for general adult medical examination without abnormal findings: Secondary | ICD-10-CM

## 2023-01-03 LAB — CBC WITH DIFFERENTIAL/PLATELET
Basophils Absolute: 0.1 10*3/uL (ref 0.0–0.1)
Basophils Relative: 0.7 % (ref 0.0–3.0)
Eosinophils Absolute: 0.1 10*3/uL (ref 0.0–0.7)
Eosinophils Relative: 0.8 % (ref 0.0–5.0)
HCT: 45.6 % (ref 36.0–46.0)
Hemoglobin: 15.5 g/dL — ABNORMAL HIGH (ref 12.0–15.0)
Lymphocytes Relative: 40.8 % (ref 12.0–46.0)
Lymphs Abs: 3.6 10*3/uL (ref 0.7–4.0)
MCHC: 33.9 g/dL (ref 30.0–36.0)
MCV: 93.7 fL (ref 78.0–100.0)
Monocytes Absolute: 0.6 10*3/uL (ref 0.1–1.0)
Monocytes Relative: 6.9 % (ref 3.0–12.0)
Neutro Abs: 4.5 10*3/uL (ref 1.4–7.7)
Neutrophils Relative %: 50.8 % (ref 43.0–77.0)
Platelets: 355 10*3/uL (ref 150.0–400.0)
RBC: 4.87 Mil/uL (ref 3.87–5.11)
RDW: 12.8 % (ref 11.5–15.5)
WBC: 8.8 10*3/uL (ref 4.0–10.5)

## 2023-01-03 LAB — LIPID PANEL
Cholesterol: 147 mg/dL (ref 0–200)
HDL: 32 mg/dL — ABNORMAL LOW (ref >39.00)
LDL Cholesterol: 92 mg/dL (ref 0–99)
NonHDL: 115.41
Total CHOL/HDL Ratio: 5
Triglycerides: 118 mg/dL (ref 0.0–149.0)
VLDL: 23.6 mg/dL (ref 0.0–40.0)

## 2023-01-03 LAB — HEPATIC FUNCTION PANEL
ALT: 12 U/L (ref 0–35)
AST: 12 U/L (ref 0–37)
Albumin: 4.3 g/dL (ref 3.5–5.2)
Alkaline Phosphatase: 123 U/L — ABNORMAL HIGH (ref 39–117)
Bilirubin, Direct: 0.1 mg/dL (ref 0.0–0.3)
Total Bilirubin: 0.5 mg/dL (ref 0.2–1.2)
Total Protein: 6.9 g/dL (ref 6.0–8.3)

## 2023-01-03 LAB — BASIC METABOLIC PANEL
BUN: 21 mg/dL (ref 6–23)
CO2: 28 meq/L (ref 19–32)
Calcium: 9.1 mg/dL (ref 8.4–10.5)
Chloride: 103 meq/L (ref 96–112)
Creatinine, Ser: 0.62 mg/dL (ref 0.40–1.20)
GFR: 96.68 mL/min (ref >60.00)
Glucose, Bld: 95 mg/dL (ref 70–99)
Potassium: 3.8 meq/L (ref 3.5–5.1)
Sodium: 139 meq/L (ref 135–145)

## 2023-01-03 LAB — TSH: TSH: 0.91 u[IU]/mL (ref 0.35–5.50)

## 2023-01-03 LAB — HEMOGLOBIN A1C: Hgb A1c MFr Bld: 6.1 % (ref 4.6–6.5)

## 2023-01-03 MED ORDER — METHYLPHENIDATE HCL ER (LA) 20 MG PO CP24: 20.0000 mg | ORAL_CAPSULE | ORAL | 0 refills | Status: DC

## 2023-01-03 MED ORDER — TRAZODONE HCL 100 MG PO TABS: 100.0000 mg | ORAL_TABLET | Freq: Every day | ORAL | 3 refills | Status: DC

## 2023-01-03 MED ORDER — SERTRALINE HCL 100 MG PO TABS: 100.0000 mg | ORAL_TABLET | Freq: Every day | ORAL | 3 refills | Status: DC

## 2023-01-03 MED ORDER — PHENTERMINE HCL 37.5 MG PO CAPS: 37.5000 mg | ORAL_CAPSULE | ORAL | 1 refills | Status: AC

## 2023-01-03 MED ORDER — LAMOTRIGINE 100 MG PO TABS: 100.0000 mg | ORAL_TABLET | Freq: Every day | ORAL | 3 refills | Status: DC

## 2023-01-03 NOTE — Progress Notes (Signed)
Subjective:    Patient ID: Emily Avery, female    DOB: [birth year], 62 y.o.   MRN: [id number]  HPI Here for a well exam. She has a few items to discuss. In the past few months she tried Information systems manager and Myrbetriq for OAB, but these did not help. Her moods have been stable and she sleeps well, so she asks if some of her medications can be decreased or stopped. Finally she asks for help with losing weight. She has tried changing her diet but she often over eats.    Review of Systems  Constitutional: Negative.   HENT: Negative.    Eyes: Negative.   Respiratory: Negative.    Cardiovascular: Negative.   Gastrointestinal: Negative.   Genitourinary:  Negative for decreased urine volume, difficulty urinating, dyspareunia, dysuria, enuresis, flank pain, frequency, hematuria, pelvic pain and urgency.  Musculoskeletal: Negative.   Skin: Negative.   Neurological: Negative.  Negative for headaches.  Psychiatric/Behavioral:  Negative for agitation, behavioral problems, confusion, decreased concentration, dysphoric mood, hallucinations and sleep disturbance. The patient is nervous/anxious.        Objective:   Physical Exam Constitutional:      General: She is not in acute distress.    Appearance: She is well-developed. She is obese.  HENT:     Head: Normocephalic and atraumatic.     Right Ear: External ear normal.     Left Ear: External ear normal.     Nose: Nose normal.     Mouth/Throat:     Pharynx: No oropharyngeal exudate.  Eyes:     General: No scleral icterus.    Conjunctiva/sclera: Conjunctivae normal.     Pupils: Pupils are equal, round, and reactive to light.  Neck:     Thyroid: No thyromegaly.     Vascular: No JVD.  Cardiovascular:     Rate and Rhythm: Normal rate and regular rhythm.     Pulses: Normal pulses.     Heart sounds: Normal heart sounds. No murmur heard.    No friction rub. No gallop.  Pulmonary:     Effort: Pulmonary effort is normal. No respiratory  distress.     Breath sounds: Normal breath sounds. No wheezing or rales.  Chest:     Chest wall: No tenderness.  Abdominal:     General: Bowel sounds are normal. There is no distension.     Palpations: Abdomen is soft. There is no mass.     Tenderness: There is no abdominal tenderness. There is no guarding or rebound.  Musculoskeletal:        General: No tenderness. Normal range of motion.     Cervical back: Normal range of motion and neck supple.  Lymphadenopathy:     Cervical: No cervical adenopathy.  Skin:    General: Skin is warm and dry.     Findings: No erythema or rash.  Neurological:     General: No focal deficit present.     Mental Status: She is alert and oriented to person, place, and time.     Cranial Nerves: No cranial nerve deficit.     Motor: No abnormal muscle tone.     Coordination: Coordination normal.     Deep Tendon Reflexes: Reflexes are normal and symmetric. Reflexes normal.  Psychiatric:        Mood and Affect: Mood normal.        Behavior: Behavior normal.        Thought Content: Thought content normal.  Judgment: Judgment normal.           Assessment & Plan:  Well exam. We discussed diet and exercise. Get fasting labs. She will try taking Phentermine 37.5 mg each morning to help with weight loss. We will decrease the Lamotrigine to 100 mg at night , and we will decrease the Trazodone to 100 mg at night.  I reminded her to set up a mammogram. Gershon Crane, MD

## 2023-01-26 ENCOUNTER — Other Ambulatory Visit (HOSPITAL_COMMUNITY): Payer: Self-pay

## 2023-01-29 ENCOUNTER — Telehealth: Payer: Self-pay | Admitting: Pharmacy Technician

## 2023-01-29 ENCOUNTER — Other Ambulatory Visit (HOSPITAL_COMMUNITY): Payer: Self-pay

## 2023-01-29 NOTE — Telephone Encounter (Signed)
 Pharmacy Patient Advocate Encounter   Received notification from CoverMyMeds that prior authorization for Phentermine  HCl 37.5MG  capsules is required/requested.   Insurance verification completed.   The patient is insured through [facility] .   Per test claim: PA required; PA submitted to above mentioned insurance via CoverMyMeds Key/confirmation #/EOC [id number] Status is pending

## 2023-02-14 DIAGNOSIS — R8271 Bacteriuria: Secondary | ICD-10-CM | POA: Diagnosis not present

## 2023-02-14 DIAGNOSIS — N3946 Mixed incontinence: Secondary | ICD-10-CM | POA: Diagnosis not present

## 2023-02-14 DIAGNOSIS — N3281 Overactive bladder: Secondary | ICD-10-CM | POA: Diagnosis not present

## 2023-02-14 NOTE — Telephone Encounter (Signed)
Pharmacy Patient Advocate Encounter  Received notification from [facility] that Prior Authorization for Phentermine HCl 37.5MG  capsules has been APPROVED from 01-29-2023 to 01-29-2024   PA #/Case ID/Reference #: [id number]

## 2023-03-06 DIAGNOSIS — N390 Urinary tract infection, site not specified: Secondary | ICD-10-CM | POA: Diagnosis not present

## 2023-03-06 DIAGNOSIS — N3946 Mixed incontinence: Secondary | ICD-10-CM | POA: Diagnosis not present

## 2023-03-06 DIAGNOSIS — B9620 Unspecified Escherichia coli [E. coli] as the cause of diseases classified elsewhere: Secondary | ICD-10-CM | POA: Diagnosis not present

## 2023-03-07 ENCOUNTER — Other Ambulatory Visit: Payer: Self-pay | Admitting: Family Medicine

## 2023-04-03 DIAGNOSIS — N390 Urinary tract infection, site not specified: Secondary | ICD-10-CM | POA: Diagnosis not present

## 2023-04-03 DIAGNOSIS — N3946 Mixed incontinence: Secondary | ICD-10-CM | POA: Diagnosis not present

## 2023-04-03 DIAGNOSIS — B9620 Unspecified Escherichia coli [E. coli] as the cause of diseases classified elsewhere: Secondary | ICD-10-CM | POA: Diagnosis not present

## 2023-04-23 DIAGNOSIS — N3946 Mixed incontinence: Secondary | ICD-10-CM | POA: Diagnosis not present

## 2023-04-26 ENCOUNTER — Other Ambulatory Visit: Payer: Self-pay | Admitting: Family Medicine

## 2023-05-17 ENCOUNTER — Other Ambulatory Visit: Payer: Self-pay | Admitting: Family Medicine

## 2023-05-30 DIAGNOSIS — N390 Urinary tract infection, site not specified: Secondary | ICD-10-CM | POA: Diagnosis not present

## 2023-05-30 DIAGNOSIS — B9620 Unspecified Escherichia coli [E. coli] as the cause of diseases classified elsewhere: Secondary | ICD-10-CM | POA: Diagnosis not present

## 2023-06-04 DIAGNOSIS — N3281 Overactive bladder: Secondary | ICD-10-CM | POA: Diagnosis not present

## 2023-06-04 DIAGNOSIS — N3946 Mixed incontinence: Secondary | ICD-10-CM | POA: Diagnosis not present

## 2023-06-20 DIAGNOSIS — N3281 Overactive bladder: Secondary | ICD-10-CM | POA: Diagnosis not present

## 2023-06-20 DIAGNOSIS — B961 Klebsiella pneumoniae [K. pneumoniae] as the cause of diseases classified elsewhere: Secondary | ICD-10-CM | POA: Diagnosis not present

## 2023-06-20 DIAGNOSIS — N3946 Mixed incontinence: Secondary | ICD-10-CM | POA: Diagnosis not present

## 2023-06-20 DIAGNOSIS — N390 Urinary tract infection, site not specified: Secondary | ICD-10-CM | POA: Diagnosis not present

## 2023-07-24 ENCOUNTER — Other Ambulatory Visit: Payer: Self-pay | Admitting: Family Medicine

## 2023-09-01 ENCOUNTER — Other Ambulatory Visit: Payer: Self-pay | Admitting: Family Medicine

## 2023-09-26 DIAGNOSIS — N3946 Mixed incontinence: Secondary | ICD-10-CM | POA: Diagnosis not present

## 2023-09-26 DIAGNOSIS — N3281 Overactive bladder: Secondary | ICD-10-CM | POA: Diagnosis not present

## 2023-11-23 ENCOUNTER — Other Ambulatory Visit: Payer: Self-pay | Admitting: Family Medicine

## 2023-11-23 NOTE — Telephone Encounter (Signed)
 Copied from CRM [id number]. Topic: Clinical - Medication Refill >> Nov 23, 2023 10:05 AM Jasmin G wrote: Medication: methylphenidate  (RITALIN  LA) 20 MG 24 hr capsule, sertraline  (ZOLOFT ) 100 MG tablet  Has the patient contacted their pharmacy? Yes (Agent: If no, request that the patient contact the pharmacy for the refill. If patient does not wish to contact the pharmacy document the reason why and proceed with request.) (Agent: If yes, when and what did the pharmacy advise?)  This is the patient's preferred pharmacy:  [id number] - [place], [place] - [address] AT [facility] [address]  [place] [id number] Phone: [phone] Fax: [phone]  Is this the correct pharmacy for this prescription? Yes If no, delete pharmacy and type the correct one.   Has the prescription been filled recently? No  Is the patient out of the medication? Yes  Has the patient been seen for an appointment in the last year OR does the patient have an upcoming appointment? Yes  Can we respond through MyChart? No  Agent: Please be advised that Rx refills may take up to 3 business days. We ask that you follow-up with your pharmacy.

## 2023-11-28 MED ORDER — METHYLPHENIDATE HCL ER (LA) 20 MG PO CP24: 20.0000 mg | ORAL_CAPSULE | ORAL | 0 refills | Status: AC

## 2023-11-28 NOTE — Telephone Encounter (Signed)
 Done

## 2023-11-29 ENCOUNTER — Telehealth: Payer: Self-pay | Admitting: Family Medicine

## 2023-11-29 NOTE — Telephone Encounter (Signed)
 Copied from CRM [id number]. Topic: Clinical - Prescription Issue >> Nov 29, 2023  8:28 AM Willma SAUNDERS wrote: Reason for CRM: Patient states the pharmacy advised the do not have the refills for methylphenidate  (RITALIN  LA) 20 MG 24 hr capsule and sertraline  (ZOLOFT ) 100 MG tablet. Advised it was sent. Patient is requesting it to be sent again.  Patient can be reached at [phone]

## 2023-11-29 NOTE — Telephone Encounter (Signed)
 Spoke with pt states that she called the pharmacy this morning and pharmacy was getting her refills ready

## 2023-11-30 ENCOUNTER — Telehealth: Payer: Self-pay | Admitting: Family Medicine

## 2023-11-30 ENCOUNTER — Other Ambulatory Visit: Payer: Self-pay | Admitting: Family Medicine

## 2023-11-30 ENCOUNTER — Other Ambulatory Visit: Payer: Self-pay

## 2023-11-30 MED ORDER — SERTRALINE HCL 100 MG PO TABS: 100.0000 mg | ORAL_TABLET | Freq: Every day | ORAL | 0 refills | Status: DC

## 2023-11-30 MED ORDER — SERTRALINE HCL 100 MG PO TABS: 100.0000 mg | ORAL_TABLET | Freq: Two times a day (BID) | ORAL | 3 refills | Status: AC

## 2023-11-30 NOTE — Telephone Encounter (Signed)
 Please advise if pt should be taking Zoloft  100 mg once daily or twice daily

## 2023-11-30 NOTE — Addendum Note (Signed)
 Addended by: [name] SENIOR A on: 11/30/2023 03:35 PM   Modules accepted: Orders

## 2023-11-30 NOTE — Telephone Encounter (Signed)
 Copied from CRM [id number]. Topic: Clinical - Prescription Issue >> Nov 30, 2023 12:54 PM Alfonso ORN wrote: Reason for CRM: pt requested to speak to nurse Inocente regarding refill request for Sertraline  HCl 100 MG spoke with Inocente stated receipt showing [facility] received rx order on Wednesday but that are now stating still not received. Pt without medication for past 3 days.   Pt wants medications to be transferred to  [id number] - , [place] - [address] AT [facility] [address]  Phone: [phone] Fax: [phone]  For this current order and going forward.

## 2023-11-30 NOTE — Telephone Encounter (Signed)
 This should be TWICE a day. I sent in a corrected RX

## 2023-12-05 NOTE — Telephone Encounter (Signed)
 Spoke with pt state that she picked up Rx from her pharmacy

## 2023-12-10 MED ORDER — ALPRAZOLAM 2 MG PO TABS: 2.0000 mg | ORAL_TABLET | Freq: Two times a day (BID) | ORAL | 1 refills | Status: AC

## 2023-12-10 NOTE — Telephone Encounter (Unsigned)
 Copied from CRM #[id number]. Topic: Clinical - Medication Refill >> Dec 10, 2023 10:14 AM Olam RAMAN wrote: Medication: alprazolam  (XANAX ) 2 MG tablet   Has the patient contacted their pharmacy? No (Agent: If no, request that the patient contact the pharmacy for the refill. If patient does not wish to contact the pharmacy document the reason why and proceed with request.) (Agent: If yes, when and what did the pharmacy advise?)  This is the patient's preferred pharmacy:   [id number] - [place], [place] - [address] AT [facility] [address] [place] [address] Phone: [phone] Fax: [phone]  Is this the correct pharmacy for this prescription? Yes If no, delete pharmacy and type the correct one.   Has the prescription been filled recently? No  Is the patient out of the medication? Yes  Has the patient been seen for an appointment in the last year OR does the patient have an upcoming appointment? No  Can we respond through MyChart? No  Agent: Please be advised that Rx refills may take up to 3 business days. We ask that you follow-up with your pharmacy.

## 2023-12-10 NOTE — Telephone Encounter (Signed)
 Done

## 2023-12-10 NOTE — Addendum Note (Signed)
 Addended by: [name] SENIOR A on: 12/10/2023 02:10 PM   Modules accepted: Orders

## 2024-01-07 DIAGNOSIS — N3281 Overactive bladder: Secondary | ICD-10-CM | POA: Diagnosis not present

## 2024-01-19 ENCOUNTER — Other Ambulatory Visit: Payer: Self-pay | Admitting: Family Medicine

## 2024-01-21 ENCOUNTER — Ambulatory Visit: Payer: Self-pay

## 2024-01-21 NOTE — Telephone Encounter (Signed)
 Call disconnected prior to warm transfer. Nurse will attempt to contact patient.

## 2024-01-21 NOTE — Telephone Encounter (Signed)
 FYI Only or Action Required?: Action required by provider: clinical question for provider.  Patient was last seen in primary care on 01/03/2023 by Emily Avery LABOR, MD.  Called Nurse Triage reporting Influenza.  Symptoms began a week ago.  Interventions attempted: OTC medications: tylenol .  Symptoms are: unchanged.  Triage Disposition: See Physician Within 24 Hours  Patient/caregiver understands and will follow disposition?: No, wishes to speak with PCP    Copied from CRM #[id number]. Topic: Clinical - Red Word Triage >> Jan 21, 2024  8:49 AM [name] wrote: Red Word that prompted transfer to Nurse Triage: Patient has flu B diagnosed with home test. Reporting fever of 107. Achy all over, no cough or congestion.    ----------------------------------------------------------------------- From previous Reason for Contact - Scheduling: Patient/patient representative is calling to schedule an appointment. Refer to attachments for appointment information. Reason for Disposition  Fever present > 3 days (72 hours)  Answer Assessment - Initial Assessment Questions No available appts with pcp. Offered appt with alt prov, patient declines and requests call back and medication.  Advised 911 if symptoms worsen: diff breathing, faint/severe weakness, chest pain > 5 min. Patient verbalized understanding.  Patient is upset and states waited 30 minutes and call dropped, reports will love Dr. [name] but will have to get another doctor, I want to speak to Dr. [name].  1. WORST SYMPTOM: What is your worst symptom? (e.g., cough, runny nose, muscle aches, headache, sore throat, fever)      Fever; 107 yesterday morning Only eating crackers, able to drink water, urinating as normal Last BM; normal 2. ONSET: When did your flu symptoms start?      Last Wedneday 3. COUGH: How bad is the cough?       denies 4. RESPIRATORY DISTRESS: Describe your breathing.      no 5. FEVER: Do you have a fever?  If Yes, ask: What is your temperature, how was it measured, and when did it start?    Nurse instructed patient to recheck temp 99.4; currently, took tylenol  2 hours ago 6. EXPOSURE: Were you exposed to someone with influenza?       no 7. FLU VACCINE: Did you get a flu shot this year?     unsure 8. HIGH RISK DISEASE: Do you have any chronic medical problems? (e.g., heart or lung disease, asthma, weak immune system, or other HIGH RISK conditions)     no ER SYMPTOMS: Do you have any other symptoms?  (e.g., runny nose, muscle aches, headache, sore throat)       Fever, body aches, HA, sweating denies Positive home test  Protocols used: Influenza (Flu) - [facility]

## 2024-01-22 ENCOUNTER — Other Ambulatory Visit: Payer: Self-pay | Admitting: Family Medicine

## 2024-01-22 ENCOUNTER — Ambulatory Visit: Payer: Self-pay

## 2024-01-22 ENCOUNTER — Other Ambulatory Visit: Payer: Self-pay

## 2024-01-22 MED ORDER — OSELTAMIVIR PHOSPHATE 75 MG PO CAPS: 75.0000 mg | ORAL_CAPSULE | Freq: Two times a day (BID) | ORAL | 0 refills | Status: AC

## 2024-01-22 NOTE — Progress Notes (Signed)
 Done

## 2024-01-22 NOTE — Telephone Encounter (Signed)
 First of all, I just sent in for 5 days of Tamiflu. She can drink fluids and take Ibuprofen  as needed. Second, I was out of the office yesterday, so how could I have possibly answered this message? I have now replied to her the very next day, which is all anyone can do

## 2024-01-22 NOTE — Telephone Encounter (Signed)
 FYI Only or Action Required?: Action required by provider: clinical question for provider.  Patient was last seen in primary care on 01/03/2023 by [name] Garnette LABOR, MD.  Called Nurse Triage reporting Influenza.  Symptoms began a week ago.  Interventions attempted: Nothing.  Symptoms are: gradually worsening.  Triage Disposition: No disposition on file.  Patient/caregiver understands and will follow disposition?:  Reason for Disposition  Patient sounds very sick or weak to the triager  Answer Assessment - Initial Assessment Questions Patient reports not being able to eat anything for 6 days or getting out of bed, ate 3 pb crackers and some fruit, drinking some water. Pain going through stomach, feels nauseous when eating something. Took a home test and was positive for Flu B. Patient is upset because PCP did not call her back, she reports incidents of PCP not calling her back and giving her medication when she calls in. Patient reports not even being able to get up out of bed, friend states patient had extreme confusion when temperature was 107. No available appointments in PCP office, advised UC or ED for symptoms. Patient states unbelievable, I want a call back from Dr. [name]. Please advise.  1. WORST SYMPTOM: What is your worst symptom? (e.g., cough, runny nose, muscle aches, headache, sore throat, fever)      Body aches, fatigue, not eating  2. ONSET: When did your flu symptoms start?      Almost a week ago  3. COUGH: How bad is the cough?       Denies  4. RESPIRATORY DISTRESS: Describe your breathing.      Denies  5. FEVER: Do you have a fever? If Yes, ask: What is your temperature, how was it measured, and when did it start?     107 2 days ago, 100.4 last night reports 98 30 minutes ago  6. HIGH RISK DISEASE: Do you have any chronic medical problems? (e.g., heart or lung disease, asthma, weak immune system, or other HIGH RISK conditions)     Patient unsure,  cannot remember.  Protocols used: Influenza (Flu) [facility]  Copied from CRM [id number]. Topic: Clinical - Red Word Triage >> Jan 21, 2024  8:49 AM [name] wrote: Red Word that prompted transfer to Nurse Triage: Patient has flu B diagnosed with home test. Reporting fever of 107. Achy all over, no cough or congestion.    ----------------------------------------------------------------------- From previous Reason for Contact - Scheduling: Patient/patient representative is calling to schedule an appointment. Refer to attachments for appointment information. >> Jan 22, 2024  8:33 AM Mercedes MATSU wrote: Patient tested positive with flu b has been sick for 7 days out of work really upset demanding to speak with dr fry.

## 2024-01-22 NOTE — Telephone Encounter (Signed)
 Spoke with pt states that she has had Abdominal pain fever since Wed, pt states that she has Flu B and needs medication for her symptom. Pt is in [place]. Pharmacy is [facility] on [address]. Please advise

## 2024-01-22 NOTE — Telephone Encounter (Signed)
 See my other reply today (I sent in Tamiflu)

## 2024-01-23 NOTE — Telephone Encounter (Signed)
 Spoke with pt advised that Rx FOR Tamiflu was sent to [facility] in [place] as requested, pt voiced understanding

## 2024-01-25 ENCOUNTER — Ambulatory Visit: Payer: Self-pay

## 2024-01-25 NOTE — Telephone Encounter (Signed)
 FYI Only or Action Required?: Action required by provider: request for appointment.  Patient was last seen in primary care on 01/03/2023 by [name] Garnette LABOR, MD.  Called Nurse Triage reporting Fever.  Symptoms began a week ago.  Interventions attempted: Prescription medications: Tamiflu.  Symptoms are: gradually worsening.Positive for flu last week. Taking Tamiflu. Fever 104 this morning. Going to UC, no availability in office.   Triage Disposition: See HCP Within 4 Hours (Or PCP Triage)  Patient/caregiver understands and will follow disposition?: Yes     Reason for Disposition  [1] Fever > 101 F (38.3 C) AND [2] age > 60 years  Answer Assessment - Initial Assessment Questions 1. TEMPERATURE: What is the most recent temperature?  How was it measured?      104 2. ONSET: When did the fever start?      Last week 3. CHILLS: Do you have chills? If yes: How bad are they?  (e.g., none, mild, moderate, severe)     chills 4. OTHER SYMPTOMS: Do you have any other symptoms besides the fever?  (e.g., abdomen pain, cough, diarrhea, earache, headache, sore throat, urination pain)     weakness 5. CAUSE: If there are no symptoms, ask: What do you think is causing the fever?      Positive flu 6. CONTACTS: Does anyone else in the family have an infection?     no 7. TREATMENT: What have you done so far to treat this fever? (e.g., OTC fever medicines)     Tamiflu 8. IMMUNOCOMPROMISE: Do you have any of the following: diabetes, HIV positive, splenectomy, cancer chemotherapy, chronic steroid treatment, transplant patient, etc.?     no 9. PREGNANCY: Is there any chance you are pregnant? When was your last menstrual period?     no 10. TRAVEL: Have you traveled out of the country in the last month? (e.g., travel history, exposures)       no  Protocols used: [facility]

## 2024-01-25 NOTE — Telephone Encounter (Signed)
 Noted

## 2024-01-27 ENCOUNTER — Other Ambulatory Visit: Payer: Self-pay | Admitting: Family Medicine

## 2024-01-31 ENCOUNTER — Ambulatory Visit: Admitting: Family Medicine

## 2024-01-31 ENCOUNTER — Encounter: Payer: Self-pay | Admitting: Family Medicine

## 2024-01-31 VITALS — BP 102/60 | HR 78 | Temp 98.4°F | Wt 183.0 lb

## 2024-01-31 DIAGNOSIS — J101 Influenza due to other identified influenza virus with other respiratory manifestations: Secondary | ICD-10-CM | POA: Diagnosis not present

## 2024-01-31 LAB — POCT INFLUENZA A/B
Influenza A, POC: NEGATIVE
Influenza B, POC: NEGATIVE

## 2024-01-31 LAB — POC COVID19 BINAXNOW: SARS Coronavirus 2 Ag: NEGATIVE

## 2024-01-31 MED ORDER — METHYLPREDNISOLONE 4 MG PO TBPK: ORAL_TABLET | ORAL | 0 refills | Status: AC

## 2024-01-31 NOTE — Addendum Note (Signed)
 Addended by: LADONNA INOCENTE SAILOR on: 01/31/2024 11:47 AM   Modules accepted: Orders

## 2024-01-31 NOTE — Progress Notes (Signed)
" ° °  Subjective:    Patient ID: Emily Avery, female    DOB: 01/10/1961, 64 y.o.   MRN: [id number]  HPI Here for influenza symptoms that began 2 weeks ago. On 01-16-24 she developed fever, body aches, weakness, and a dry cough. No NVD. The next day she tested positive for influenza B at home. She went to an urgent care, and they gave her 2 days of Tamiflu . She took this but nothing changed. She is drinking fluids and taking Tylenol . No SOB.    Review of Systems  Constitutional:  Positive for fatigue and fever.  HENT: Negative.    Respiratory:  Positive for cough. Negative for shortness of breath and wheezing.   Cardiovascular: Negative.   Gastrointestinal: Negative.        Objective:   Physical Exam Constitutional:      Appearance: She is ill-appearing.  HENT:     Right Ear: Tympanic membrane, ear canal and external ear normal.     Left Ear: Tympanic membrane, ear canal and external ear normal.     Nose: Nose normal.     Mouth/Throat:     Pharynx: Oropharynx is clear.  Eyes:     Conjunctiva/sclera: Conjunctivae normal.  Pulmonary:     Effort: Pulmonary effort is normal.     Breath sounds: Normal breath sounds.  Lymphadenopathy:     Cervical: No cervical adenopathy.  Neurological:     Mental Status: She is alert.           Assessment & Plan:  Influenza B. We will give her a Medrol  dose pack to help with symptoms. This shoud resolve in the next few days.  Garnette Olmsted, MD   "

## 2024-02-24 ENCOUNTER — Other Ambulatory Visit: Payer: Self-pay | Admitting: Family Medicine

## 2024-02-25 ENCOUNTER — Other Ambulatory Visit: Payer: Self-pay | Admitting: Family Medicine
